# Patient Record
Sex: Female | Born: 1975 | Race: White | Hispanic: Yes | Marital: Single | Smoking: Never smoker
Health system: Southern US, Community
[De-identification: ages and names within clinical notes are randomized; demographics above are authoritative.]

## PROBLEM LIST (undated history)

## (undated) HISTORY — PX: BREAST BIOPSY: SHX20

## (undated) HISTORY — PX: APPENDECTOMY: SHX54

---

## 2005-01-19 ENCOUNTER — Emergency Department (HOSPITAL_COMMUNITY): Admission: EM | Admit: 2005-01-19 | Discharge: 2005-01-19 | Payer: Self-pay | Admitting: *Deleted

## 2007-03-31 ENCOUNTER — Inpatient Hospital Stay (HOSPITAL_COMMUNITY): Admission: AD | Admit: 2007-03-31 | Discharge: 2007-03-31 | Payer: Self-pay | Admitting: Obstetrics & Gynecology

## 2007-09-05 ENCOUNTER — Emergency Department (HOSPITAL_COMMUNITY): Admission: EM | Admit: 2007-09-05 | Discharge: 2007-09-06 | Payer: Self-pay | Admitting: Emergency Medicine

## 2007-12-10 ENCOUNTER — Emergency Department (HOSPITAL_COMMUNITY): Admission: EM | Admit: 2007-12-10 | Discharge: 2007-12-10 | Payer: Self-pay | Admitting: Emergency Medicine

## 2008-08-26 ENCOUNTER — Emergency Department (HOSPITAL_COMMUNITY): Admission: EM | Admit: 2008-08-26 | Discharge: 2008-08-26 | Payer: Self-pay | Admitting: Emergency Medicine

## 2010-05-31 ENCOUNTER — Encounter: Payer: Self-pay | Admitting: Internal Medicine

## 2010-09-22 NOTE — Assessment & Plan Note (Signed)
[facility]                        [place] OFFICE NOTE   NAME:MILESJodene, Polyak                           MRN:          [id number]  DATE:03/31/2007                            DOB:          1976-04-12    [name] is a 35 year old female who is not a patient at [employer] [facility] who presented in the waiting room with significant vaginal  bleeding and weakness.  Given her state she was brought back to make  sure she was stable and EMS was called immediately.  I went into the  room to make sure that patient was stable.  She reported that she was in  her normal state of affairs until this morning when she started noting  vaginal bleeding/spotting.  After a few minutes she passed significant  amount of large blood clots.  She continued to bleed heavily and became  very scared.  She became lightheaded but did not know if that was due to  her anxiety of the situation or the bleeding.  She reports that her last  menstrual period was on October 2 and she has not had her menstrual  period for November.  She does admit that her cycles are not regular.   As I waited for EMS, and since patient was still having obvious  bleeding, I examined her.  Marcelino Duster, my nurse, was present.   VITAL SIGNS:  Temperature 98.0, blood pressure lying down 98/58, pulse  68, respiratory rate of 18.  We had a female who appeared very anxious  and nervous but in no acute respiratory distress.  ABDOMINAL:  Significant for no significant tenderness, no palpable  masses.  PELVIC:  Significant for blood coming from the vaginal vault.  Partial  insertion of the speculum was significant for bleeding coming from the  cervical os.   EMS had arrived and I have advised EMS of the situation.  Patient was to  be transferred to [facility] per EMS personnel.   The patient was given emotional support by her husband and my nurse  Marcelino Duster given her fear of a possible  miscarriage.     Leanne Chang, M.D.  Electronically Signed    LA/MedQ  DD: 03/31/2007  DT: 04/01/2007  Job #: [id number]

## 2011-02-16 LAB — DIFFERENTIAL
Basophils Relative: 0
Monocytes Absolute: 0.5
Monocytes Relative: 6
Neutro Abs: 5.7
Neutrophils Relative %: 70

## 2011-02-16 LAB — URINALYSIS, ROUTINE W REFLEX MICROSCOPIC
Bilirubin Urine: NEGATIVE
Glucose, UA: NEGATIVE
Nitrite: NEGATIVE
pH: 6.5

## 2011-02-16 LAB — CBC
MCV: 91.5
RDW: 13.7
WBC: 8.1

## 2011-02-16 LAB — URINE MICROSCOPIC-ADD ON

## 2011-02-16 LAB — WET PREP, GENITAL: Trich, Wet Prep: NONE SEEN

## 2011-02-16 LAB — POCT PREGNANCY, URINE
Operator id: 113551
Preg Test, Ur: NEGATIVE

## 2012-12-11 ENCOUNTER — Other Ambulatory Visit: Payer: Self-pay | Admitting: Infectious Diseases

## 2012-12-11 ENCOUNTER — Ambulatory Visit
Admission: RE | Admit: 2012-12-11 | Discharge: 2012-12-11 | Disposition: A | Discharge status: Home / Self Care | Payer: No Typology Code available for payment source | Source: Ambulatory Visit | Attending: Infectious Diseases | Admitting: Infectious Diseases

## 2012-12-11 DIAGNOSIS — R7611 Nonspecific reaction to tuberculin skin test without active tuberculosis: Secondary | ICD-10-CM

## 2016-02-18 ENCOUNTER — Other Ambulatory Visit: Payer: Self-pay | Admitting: Infectious Disease

## 2016-02-18 ENCOUNTER — Ambulatory Visit
Admission: RE | Admit: 2016-02-18 | Discharge: 2016-02-18 | Disposition: A | Discharge status: Home / Self Care | Payer: No Typology Code available for payment source | Source: Ambulatory Visit | Attending: Infectious Disease | Admitting: Infectious Disease

## 2016-02-18 DIAGNOSIS — R7611 Nonspecific reaction to tuberculin skin test without active tuberculosis: Secondary | ICD-10-CM

## 2016-09-07 ENCOUNTER — Encounter (HOSPITAL_COMMUNITY): Payer: Self-pay | Admitting: Family Medicine

## 2016-09-07 ENCOUNTER — Ambulatory Visit (HOSPITAL_COMMUNITY)
Admission: EM | Admit: 2016-09-07 | Discharge: 2016-09-07 | Disposition: A | Discharge status: Home / Self Care | Payer: No Typology Code available for payment source | Attending: Family Medicine | Admitting: Family Medicine

## 2016-09-07 DIAGNOSIS — J209 Acute bronchitis, unspecified: Secondary | ICD-10-CM

## 2016-09-07 DIAGNOSIS — J309 Allergic rhinitis, unspecified: Secondary | ICD-10-CM

## 2016-09-07 MED ORDER — CETIRIZINE HCL 10 MG PO TABS: 10.0000 mg | ORAL_TABLET | Freq: Every day | ORAL | 0 refills | Status: DC

## 2016-09-07 MED ORDER — IPRATROPIUM BROMIDE 0.03 % NA SOLN: 2.0000 | Freq: Two times a day (BID) | NASAL | 0 refills | Status: DC

## 2016-09-07 MED ORDER — AZITHROMYCIN 250 MG PO TABS: ORAL_TABLET | ORAL | 0 refills | Status: DC

## 2016-09-07 NOTE — ED Triage Notes (Signed)
Pt here for 3 weeks of nasal congestion, coughing and watery eyes but worsening over the past week, sts she has been feverish and weak and SOB at times.

## 2016-09-07 NOTE — Discharge Instructions (Signed)
Use zyrtec and atrovent nasal spray daily to help with allergies. Take azithromycin for the next 5 days to help with bronchitis/cough. You should return if your symptoms get worse. It is recommended that you establish care with a PCP for ongoing care.

## 2016-09-07 NOTE — ED Provider Notes (Signed)
[facility]    CSN: [id number] Arrival date & time: 09/07/16  1329     History   Chief Complaint Chief Complaint  Patient presents with  . Cough  . Nasal Congestion    HPI Alexandra Boyd is a 41 y.o. female with a history of allergies presenting for 3 weeks of cough and nasal congestion.   She has had constant, worsening nasal congestion and clear rhinorrhea for about 1 months and 3 weeks of cough that has worsened with sputum over the past week. She's had subjective fevers and occasional dyspnea though currently denies dyspnea, wheezing, chest pain, PND, orthopnea, leg swelling. No medications tried. Non smoker, but had chemical irritation pneumonitis in the past (2010).   HPI  History reviewed. No pertinent past medical history.  There are no active problems to display for this patient.   History reviewed. No pertinent surgical history.  OB History    No data available       Home Medications    Prior to Admission medications   Medication Sig Start Date End Date Taking? Authorizing Provider  azithromycin (ZITHROMAX) 250 MG tablet Take 2 tabs once than 1 tab daily for 4 days 09/07/16   Tyrone Nine, MD  cetirizine (ZYRTEC) 10 MG tablet Take 1 tablet (10 mg total) by mouth daily. 09/07/16   Tyrone Nine, MD  ipratropium (ATROVENT) 0.03 % nasal spray Place 2 sprays into both nostrils every 12 (twelve) hours. 09/07/16   Tyrone Nine, MD    Family History History reviewed. No pertinent family history.  Social History Social History  Substance Use Topics  . Smoking status: Never Smoker  . Smokeless tobacco: Never Used  . Alcohol use Not on file     Allergies   Patient has no known allergies.   Review of Systems Review of Systems Per HPI  Physical Exam Triage Vital Signs ED Triage Vitals [09/07/16 1354]  Enc Vitals Group     BP 102/71     Pulse Rate 82     Resp 18     Temp 98.6 F (37 C)     Temp src      SpO2 96 %     Weight      Height    Head Circumference      Peak Flow      Pain Score      Pain Loc      Pain Edu?      Excl. in GC?    No data found.   Updated Vital Signs BP 102/71   Pulse 82   Temp 98.6 F (37 C)   Resp 18   LMP 09/01/2016   SpO2 96%   Physical Exam  Constitutional: She appears well-developed and well-nourished. No distress.  HENT:  Head: Normocephalic and atraumatic.  Right Ear: External ear normal.  Left Ear: External ear normal.  Mouth/Throat: Oropharynx is clear and moist.  boggy turbinates, no exudates  Eyes: Conjunctivae are normal. Pupils are equal, round, and reactive to light.  Neck: Neck supple. No JVD present.  Cardiovascular: Normal rate and regular rhythm.  Exam reveals no friction rub.   No murmur heard. Pulmonary/Chest: Effort normal and breath sounds normal. No respiratory distress. She has no wheezes.  Abdominal: Soft. There is no tenderness.  Musculoskeletal: She exhibits no edema.  Lymphadenopathy:    She has no cervical adenopathy.  Neurological: She is alert.  Skin: Skin is warm and dry. Capillary refill takes less  than 2 seconds.  Psychiatric: She has a normal mood and affect.  Nursing note and vitals reviewed.    UC Treatments / Results  Labs (all labs ordered are listed, but only abnormal results are displayed) Labs Reviewed - No data to display  EKG  EKG Interpretation None       Radiology No results found.  Procedures Procedures (including critical care time)  Medications Ordered in UC Medications - No data to display   Initial Impression / Assessment and Plan / UC Course  I have reviewed the triage vital signs and the nursing notes.  Pertinent labs & imaging results that were available during my care of the patient were reviewed by me and considered in my medical decision making (see chart for details).  Final Clinical Impressions(s) / UC Diagnoses   Final diagnoses:  Acute bronchitis, unspecified organism  Allergic rhinitis,  unspecified seasonality, unspecified trigger   41 y.o. female with allergies.  - Atrovent nasal spray, zyrtec - Will also cover for atypical pathogens with azithromycin - Refer to PCP/allergist for ongoing work up  [employer]  Prescriptions New Prescriptions   AZITHROMYCIN (ZITHROMAX) 250 MG TABLET    Take 2 tabs once than 1 tab daily for 4 days   CETIRIZINE (ZYRTEC) 10 MG TABLET    Take 1 tablet (10 mg total) by mouth daily.   IPRATROPIUM (ATROVENT) 0.03 % NASAL SPRAY    Place 2 sprays into both nostrils every 12 (twelve) hours.     Tyrone Nine, MD 09/07/16 [phone]

## 2016-12-14 ENCOUNTER — Telehealth (HOSPITAL_COMMUNITY): Payer: Self-pay | Admitting: *Deleted

## 2016-12-14 ENCOUNTER — Other Ambulatory Visit: Payer: Self-pay | Admitting: Obstetrics and Gynecology

## 2016-12-14 DIAGNOSIS — N63 Unspecified lump in unspecified breast: Secondary | ICD-10-CM

## 2016-12-14 DIAGNOSIS — N644 Mastodynia: Secondary | ICD-10-CM

## 2016-12-14 NOTE — Telephone Encounter (Signed)
Called patient with Spanish interpreter Nira ConnJulia Sowell to schedule appointment with [facility]. Appointment scheduled for Tuesday, December 28, 2016 at 0845. Gave patient appointment and details. Patient verbalized understanding.

## 2016-12-28 ENCOUNTER — Encounter (HOSPITAL_COMMUNITY): Payer: Self-pay

## 2016-12-28 ENCOUNTER — Other Ambulatory Visit: Payer: Self-pay | Admitting: Obstetrics and Gynecology

## 2016-12-28 ENCOUNTER — Ambulatory Visit
Admission: RE | Admit: 2016-12-28 | Discharge: 2016-12-28 | Disposition: A | Discharge status: Home / Self Care | Payer: Self-pay | Source: Ambulatory Visit | Attending: Obstetrics and Gynecology | Admitting: Obstetrics and Gynecology

## 2016-12-28 ENCOUNTER — Ambulatory Visit
Admission: RE | Admit: 2016-12-28 | Discharge: 2016-12-28 | Disposition: A | Discharge status: Home / Self Care | Payer: No Typology Code available for payment source | Source: Ambulatory Visit | Attending: Obstetrics and Gynecology | Admitting: Obstetrics and Gynecology

## 2016-12-28 ENCOUNTER — Ambulatory Visit (HOSPITAL_COMMUNITY)
Admission: RE | Admit: 2016-12-28 | Discharge: 2016-12-28 | Disposition: A | Discharge status: Home / Self Care | Payer: Self-pay | Source: Ambulatory Visit | Attending: Obstetrics and Gynecology | Admitting: Obstetrics and Gynecology

## 2016-12-28 VITALS — Temp 97.9°F | Ht 65.0 in | Wt 132.5 lb

## 2016-12-28 DIAGNOSIS — N63 Unspecified lump in unspecified breast: Secondary | ICD-10-CM

## 2016-12-28 DIAGNOSIS — N644 Mastodynia: Secondary | ICD-10-CM

## 2016-12-28 DIAGNOSIS — Z1239 Encounter for other screening for malignant neoplasm of breast: Secondary | ICD-10-CM

## 2016-12-28 DIAGNOSIS — N6310 Unspecified lump in the right breast, unspecified quadrant: Secondary | ICD-10-CM

## 2016-12-28 DIAGNOSIS — N6315 Unspecified lump in the right breast, overlapping quadrants: Secondary | ICD-10-CM

## 2016-12-28 NOTE — Patient Instructions (Signed)
Explained breast self awareness with Tillie Fantasia. Patient did not need a Pap smear today due to last Pap smear was 11/07/2016 per patient. Let her know [facility] will cover Pap smears every 3 years unless has a history of abnormal Pap smears. Referred patient to the [facility] for diagnostic mammogram and right breast ultrasound. Appointment scheduled for Tuesday, December 28, 2016 at 0920. Ziasia Vongphachanh verbalized understanding.  Eliot Popper, Kathaleen Maser, RN 9:19 AM

## 2016-12-28 NOTE — Progress Notes (Signed)
Complaints of right breast lump since 2002 that a biopsy was completed in [place] in 2004. Patient is unsure of results and recommended follow up. Patient stated she had [facility]. Patient stated the pain has increased within the right breast. Patient states the pain comes and goes. Patient rates the pain at a 10 out of 10.  Pap Smear: Pap smear not completed today. Last Pap smear was 11/07/2016 at the [facility] and normal per patient. Per patient has no history of an abnormal Pap smear. No Pap smear results are in EPIC.  Physical exam: Breasts Breasts symmetrical. No skin abnormalities bilateral breasts. No nipple retraction bilateral breasts. No nipple discharge bilateral breasts. No lymphadenopathy. No lumps palpated left breast. Palpated a lump within the right breast at 9 o'clock 3 cm from the nipple. Complaints of bilateral outer and right nipple pain on exam. Referred patient to the [facility] for diagnostic mammogram and right breast ultrasound. Appointment scheduled for Tuesday, December 28, 2016 at 0920.        Pelvic/Bimanual No Pap smear completed today since last Pap smear was 11/07/2016 per patient. Pap smear not indicated per [facility] guidelines.   Smoking History: Patient has never smoked.  Patient Navigation: Patient education provided. Access to services provided for patient through [facility] program.

## 2017-06-30 ENCOUNTER — Other Ambulatory Visit: Payer: No Typology Code available for payment source

## 2017-10-31 ENCOUNTER — Encounter (HOSPITAL_COMMUNITY): Payer: Self-pay | Admitting: Emergency Medicine

## 2017-10-31 ENCOUNTER — Ambulatory Visit (HOSPITAL_COMMUNITY)
Admission: EM | Admit: 2017-10-31 | Discharge: 2017-10-31 | Disposition: A | Discharge status: Home / Self Care | Payer: No Typology Code available for payment source | Attending: Family Medicine | Admitting: Family Medicine

## 2017-10-31 DIAGNOSIS — H209 Unspecified iridocyclitis: Secondary | ICD-10-CM

## 2017-10-31 DIAGNOSIS — H5712 Ocular pain, left eye: Secondary | ICD-10-CM

## 2017-10-31 MED ORDER — PREDNISOLONE ACETATE 1 % OP SUSP: 1.0000 [drp] | Freq: Three times a day (TID) | OPHTHALMIC | 0 refills | Status: DC | PRN

## 2017-10-31 NOTE — ED Provider Notes (Signed)
[facility]   [id number] 10/31/17 Arrival Time: 1944   SUBJECTIVE:  Alexandra FantasiaOlga Boyd is a 42 y.o. female who presents to the urgent care with complaint of sharp pain in left eye. Affected eye is red, denies itchiness. Blurry vision.  H/O arthritis when aged 42.  No rash, mouth sores or significant joint pain now  No eye discharge.  Some headache on left side.   History reviewed. No pertinent past medical history. No family history on file. Social History   Socioeconomic History  . Marital status: Single    Spouse name: Not on file  . Number of children: Not on file  . Years of education: Not on file  . Highest education level: Not on file  Occupational History  . Not on file  Social Needs  . Financial resource strain: Not on file  . Food insecurity:    Worry: Not on file    Inability: Not on file  . Transportation needs:    Medical: Not on file    Non-medical: Not on file  Tobacco Use  . Smoking status: Never Smoker  . Smokeless tobacco: Never Used  Substance and Sexual Activity  . Alcohol use: No  . Drug use: No  . Sexual activity: Yes    Partners: Male    Birth control/protection: None  Lifestyle  . Physical activity:    Days per week: Not on file    Minutes per session: Not on file  . Stress: Not on file  Relationships  . Social connections:    Talks on phone: Not on file    Gets together: Not on file    Attends religious service: Not on file    Active member of club or organization: Not on file    Attends meetings of clubs or organizations: Not on file    Relationship status: Not on file  . Intimate partner violence:    Fear of current or ex partner: Not on file    Emotionally abused: Not on file    Physically abused: Not on file    Forced sexual activity: Not on file  Other Topics Concern  . Not on file  Social History Narrative  . Not on file   No outpatient medications have been marked as taking for the 10/31/17 encounter [facility](Hospital  Encounter).   No Known Allergies    ROS: As per HPI, remainder of ROS negative.   OBJECTIVE:   Vitals:   10/31/17 2007  BP: 104/70  Pulse: 71  Resp: 16  Temp: 98.9 F (37.2 C)  TempSrc: Oral  SpO2: 100%  Weight: 130 lb (59 kg)     General appearance: alert; no distress Eyes: PERRL; EOMI; conjunctiva injected medially HENT: normocephalic; atraumatic; TMs normal, canal normal, external ears normal without trauma; nasal mucosa normal; oral mucosa normal Neck: supple Lungs: clear to auscultation bilaterally Heart: regular rate and rhythm Back: no CVA tenderness Extremities: no cyanosis or edema; symmetrical with no gross deformities Skin: warm and dry Neurologic: normal gait; grossly normal Psychological: alert and cooperative; normal mood and affect      Labs:  Results for orders placed or performed during the hospital encounter of 03/31/07  Wet prep, genital  Result Value Ref Range   Yeast Wet Prep HPF POC NONE SEEN    Trich, Wet Prep NONE SEEN    Clue Cells Wet Prep HPF POC NONE SEEN    WBC, Wet Prep HPF POC FEW MANY BACTERIA SEEN (A)   Urinalysis, Routine  w reflex microscopic  Result Value Ref Range   Color, Urine YELLOW    APPearance CLOUDY (A)    Specific Gravity, Urine 1.020    pH 6.5    Glucose, UA NEGATIVE    Hgb urine dipstick LARGE (A)    Bilirubin Urine NEGATIVE    Ketones, ur NEGATIVE    Protein, ur 100 (A)    Urobilinogen, UA 0.2    Nitrite NEGATIVE    Leukocytes, UA NEGATIVE   Urine microscopic-add on  Result Value Ref Range   Squamous Epithelial / LPF FEW (A)    WBC, UA 0-2    RBC / HPF TOO NUMEROUS TO COUNT    Bacteria, UA FEW (A)   CBC  Result Value Ref Range   WBC 8.1    RBC 3.57 (L)    Hemoglobin 11.3 (L)    HCT 32.7 (L)    MCV 91.5    MCHC 34.6    RDW 13.7    Platelets 183   Differential  Result Value Ref Range   Neutrophils Relative % 70    Neutro Abs 5.7    Lymphocytes Relative 22    Lymphs Abs 1.8    Monocytes  Relative 6    Monocytes Absolute 0.5    Eosinophils Relative 1    Eosinophils Absolute 0.1 (L)    Basophils Relative 0    Basophils Absolute 0.0   GC/chlamydia probe amp, genital  Result Value Ref Range   GC Probe Amp, Genital      NEGATIVE (NOTE)  Testing performed using the BD Probetec ET Chlamydia trachomatis and Neisseria gonorrhea amplified DNA assay.   Chlamydia, DNA Probe      NEGATIVE (NOTE)  Testing performed using the BD Probetec ET Chlamydia trachomatis and Neisseria gonorrhea amplified DNA assay.  Pregnancy, urine POC  Result Value Ref Range   Operator id [id number]    Preg Test, Ur      NEGATIVE        THE SENSITIVITY OF THIS METHODOLOGY IS >24 mIU/mL    Labs Reviewed - No data to display  No results found.     ASSESSMENT & PLAN:  1. Iritis     Meds ordered this encounter  Medications  . prednisoLONE acetate (PRED FORTE) 1 % ophthalmic suspension    Sig: Place 1 drop into the left eye 3 (three) times daily as needed.    Dispense:  5 mL    Refill:  0    Reviewed expectations re: course of current medical issues. Questions answered. Outlined signs and symptoms indicating need for more acute intervention. Patient verbalized understanding. After Visit Summary given.       Elvina Sidle, MD 10/31/17 2049

## 2017-10-31 NOTE — Discharge Instructions (Signed)
If the eye redness and discomfort persists by Wednesday, call the eye doctor for an appointment.

## 2017-10-31 NOTE — ED Triage Notes (Signed)
PT reports sharp pain in left eye. Affected eye is red, denies itchiness. Blurry vision.

## 2018-05-09 ENCOUNTER — Ambulatory Visit (HOSPITAL_COMMUNITY)
Admission: EM | Admit: 2018-05-09 | Discharge: 2018-05-09 | Disposition: A | Discharge status: Home / Self Care | Payer: Self-pay | Attending: Family Medicine | Admitting: Family Medicine

## 2018-05-09 ENCOUNTER — Encounter (HOSPITAL_COMMUNITY): Payer: Self-pay | Admitting: Emergency Medicine

## 2018-05-09 DIAGNOSIS — K625 Hemorrhage of anus and rectum: Secondary | ICD-10-CM | POA: Insufficient documentation

## 2018-05-09 DIAGNOSIS — K5909 Other constipation: Secondary | ICD-10-CM | POA: Insufficient documentation

## 2018-05-09 MED ORDER — POLYETHYLENE GLYCOL 3350 17 G PO PACK: 17.0000 g | PACK | Freq: Every day | ORAL | 0 refills | Status: DC

## 2018-05-09 NOTE — ED Provider Notes (Signed)
[facility]    CSN: [id number] Arrival date & time: 05/09/18  1128     History   Chief Complaint Chief Complaint  Patient presents with  . Rectal Bleeding    HPI Alexandra Boyd is a 42 y.o. female who presents to the UC for rectal bleeding that started 3 days ago. Patient describes the bleeding as bright red. Patient reports constipation. Last BM this morning and was hard. Patient reports occasional cramping in left lower abdomen. Patient concerned because her brother and sister had colonoscopy and had polyps.     HPI  History reviewed. No pertinent past medical history.  There are no active problems to display for this patient.   Past Surgical History:  Procedure Laterality Date  . BREAST BIOPSY Right     OB History    Gravida  2   Para  1   Term      Preterm      AB  1   Living  1     SAB  1   TAB      Ectopic      Multiple      Live Births  1            Home Medications    Prior to Admission medications   Medication Sig Start Date End Date Taking? Authorizing Provider  polyethylene glycol (MIRALAX) packet Take 17 g by mouth daily. 05/09/18   Janne NapoleonNeese, Smokey Melott M, NP    Family History History reviewed. No pertinent family history.  Social History Social History   Tobacco Use  . Smoking status: Never Smoker  . Smokeless tobacco: Never Used  Substance Use Topics  . Alcohol use: No  . Drug use: No     Allergies   Patient has no known allergies.   Review of Systems Review of Systems  Gastrointestinal: Positive for blood in stool and constipation. Abdominal pain: cramping.  All other systems reviewed and are negative.    Physical Exam Triage Vital Signs ED Triage Vitals  Enc Vitals Group     BP 05/09/18 1308 (!) 101/58     Pulse Rate 05/09/18 1308 72     Resp 05/09/18 1308 18     Temp 05/09/18 1308 98 F (36.7 C)     Temp Source 05/09/18 1308 Oral     SpO2 05/09/18 1308 100 %     Weight --      Height --    Head Circumference --      Peak Flow --      Pain Score 05/09/18 1309 0     Pain Loc --      Pain Edu? --      Excl. in GC? --    No data found.  Updated Vital Signs BP (!) 101/58 (BP Location: Left Arm)   Pulse 72   Temp 98 F (36.7 C) (Oral)   Resp 18   SpO2 100%   Visual Acuity   Physical Exam Vitals signs and nursing note reviewed.  Constitutional:      Appearance: She is well-developed.  HENT:     Head: Normocephalic and atraumatic.     Nose: Nose normal.  Eyes:     Conjunctiva/sclera: Conjunctivae normal.  Neck:     Musculoskeletal: Neck supple.  Cardiovascular:     Rate and Rhythm: Normal rate.  Pulmonary:     Effort: Pulmonary effort is normal.  Abdominal:     Palpations: Abdomen is soft.  Comments: Mildly tender with deep palpation LLQ. No rebound or guarding.   Genitourinary:    Rectum: No tenderness or external hemorrhoid.     Comments: Hard stool palpated on rectal exam. Musculoskeletal: Normal range of motion.  Skin:    General: Skin is warm and dry.  Neurological:     Mental Status: She is alert and oriented to person, place, and time.  Psychiatric:        Mood and Affect: Mood normal.      UC Treatments / Results  Labs (all labs ordered are listed, but only abnormal results are displayed) Labs Reviewed - No data to display  Radiology No results found.  Procedures Procedures (including critical care time)  Medications Ordered in UC Medications - No data to display  Initial Impression / Assessment and Plan / UC Course  I have reviewed the triage vital signs and the nursing notes. 42 y.o. female here with constipation and bright red blood with straining with stool stable for d/c with normal VS and no bleeding noted at this time. Will treat for constipation with Miralax and patient will f/u with GI if symptoms persist.   Final Clinical Impressions(s) / UC Diagnoses   Final diagnoses:  Other constipation  Rectal bleeding    Discharge Instructions   None    ED Prescriptions    Medication Sig Dispense Auth. Provider   polyethylene glycol (MIRALAX) packet Take 17 g by mouth daily. 14 each Janne NapoleonNeese, Aayla Marrocco M, NP     Controlled Substance Prescriptions [place] Controlled Substance Registry consulted? Not Applicable   Janne Napoleoneese, Alishia Lebo M, [place] 05/09/18 [phone]

## 2018-05-09 NOTE — ED Triage Notes (Signed)
Pt sts rectal bleeding with red blood x 3 days

## 2018-05-09 NOTE — ED Notes (Signed)
At bedside for provider rectal exam

## 2018-06-02 ENCOUNTER — Ambulatory Visit: Payer: Self-pay | Attending: Family Medicine | Admitting: Family Medicine

## 2018-06-02 ENCOUNTER — Encounter: Payer: Self-pay | Admitting: Family Medicine

## 2018-06-02 VITALS — BP 101/68 | HR 98 | Temp 98.4°F | Resp 18 | Ht 67.0 in | Wt 137.0 lb

## 2018-06-02 DIAGNOSIS — R195 Other fecal abnormalities: Secondary | ICD-10-CM | POA: Insufficient documentation

## 2018-06-02 DIAGNOSIS — K5900 Constipation, unspecified: Secondary | ICD-10-CM

## 2018-06-02 DIAGNOSIS — Z23 Encounter for immunization: Secondary | ICD-10-CM

## 2018-06-02 DIAGNOSIS — Z800 Family history of malignant neoplasm of digestive organs: Secondary | ICD-10-CM

## 2018-06-02 DIAGNOSIS — Z91018 Allergy to other foods: Secondary | ICD-10-CM

## 2018-06-02 MED ORDER — EPINEPHRINE 0.3 MG/0.3ML IJ SOAJ: 0.3000 mg | INTRAMUSCULAR | 11 refills | Status: AC | PRN

## 2018-06-02 MED ORDER — POLYETHYLENE GLYCOL 3350 17 G PO PACK: 17.0000 g | PACK | Freq: Every day | ORAL | 3 refills | Status: AC

## 2018-06-02 NOTE — Progress Notes (Signed)
Subjective:    Patient ID: Alexandra Boyd, female    DOB: 15-Oct-1975, 43 y.o.   MRN: [id number]  HPI        43 yo female new to the practice who is s/p [facility] urgent care visit for rectal bleeding and constipation on 05/09/2018.  At the time of her urgent care visit, patient was having constipation and noticed some bright red blood with passage of a hard bowel movement.  Patient was also having some crampy left lower abdominal discomfort.  Patient states that since that time she has noticed no further bleeding.  Patient has increased her intake of water and fiber.  Patient states that she was prescribed MiraLAX and only received about 14 packets which she has now completed.  Patient would like a new prescription for MiraLAX packets.      Patient states that her brother and sister have both previously had colonoscopies and had polyps which were removed.  Patient states that her brother was recently told that 1 of his polyps was abnormal, cancerous but has not yet seen a specialist to set up a treatment plan.  Patient states that she is very concerned about colon cancer and would like to be referred to have a colonoscopy.  Patient denies any abnormal weight loss.  Patient states that she has actually been following a diet plan which has led to weight loss ([employer]).  Patient has had no night sweats, no fever or chills.  Patient denies any increase in fatigue.  Patient has had no further rectal bleeding, no further change in bowel habits-bowel movements are now regular, no black stools or dark-colored stools.      Patient reports that she does not drink or smoke.  Patient is currently in a committed relationship.  Patient has family history of father with stomach cancer in addition to sister with colon polyps and brother with recent diagnosis of colon cancer.  Patient has had no significant past ongoing medical problems.  Patient reports surgical history of appendectomy and benign biopsy of the right  breast.  Patient reports that she has been referred to have her mammogram.  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  Patient states that when she eats apples she feels as if her throat is swelling/closing and she has difficulty talking.  Patient states that she tends to carry Benadryl with her as this will help if she accidentally eats apples.  Patient has never required emergency department evaluation due to her apple allergy.   Review of Systems  Constitutional: Negative for chills, fatigue and fever.  HENT: Negative for congestion, sore throat and trouble swallowing.   Respiratory: Negative for cough and shortness of breath.   Cardiovascular: Negative for chest pain, palpitations and leg swelling.  Gastrointestinal: Negative for abdominal distention, abdominal pain, anal bleeding, blood in stool, constipation, diarrhea, nausea, rectal pain and vomiting.  Endocrine: Negative for polydipsia, polyphagia and polyuria.  Genitourinary: Negative for dysuria, flank pain and frequency.  Musculoskeletal: Negative for back pain and gait problem.  Allergic/Immunologic: Positive for food allergies. Negative for environmental allergies and immunocompromised state.  Neurological: Negative for dizziness and headaches.  Hematological: Negative for adenopathy. Does not bruise/bleed easily.       Objective:   Physical Exam BP 101/68 (BP Location: Left Arm, Patient Position: Sitting, Cuff Size: Normal)   Pulse 98   Temp 98.4 F (36.9 C) (Oral)   Resp 18   Ht 5\' 7"  (1.702  m)   Wt 137 lb (62.1 kg)   LMP 06/02/2018   SpO2 100%   BMI 21.46 kg/m Vital signs reviewed, no nursing note available General-well-nourished, well-developed female in no acute distress EENT-conjunctiva normal, no jaundice, extraocular movements intact.  TMs gray bilaterally, nares within normal, patient with mild posterior pharynx erythema.  Neck-supple, no lymphadenopathy, no thyromegaly,  no carotid bruit Lungs-clear to auscultation bilaterally Cardiovascular-regular rate and regular rhythm Abdomen-normal bowel sounds, soft and nontender Back-no CVA tenderness Extremities-no edema      Assessment & Plan:  1. Constipation, unspecified constipation type Patient with ED visit for constipation which patient feels has now resolved since she has increased water intake as well as increase the use of fiber.  Patient would like to have a new prescription for MiraLAX packets therefore this was sent into the pharmacy.  Patient has been asked to return for future labs including TSH, BMP and CBC as she wishes to wait until she has had a chance to apply for financial assistance program through this office.  Patient will also be referred to GI referral will be delayed for approximately 5 to 6 weeks in order to give patient time to apply for financial assistance program through this office.  Patient is also to do home fecal occult blood test to see if she may have some microscopic blood in the stool. - Fecal occult blood, imunochemical([facility]) - Basic Metabolic Panel; Future - TSH; Future - CBC with Differential; Future - Ambulatory referral to Gastroenterology - polyethylene glycol (MIRALAX) packet; Take 17 g by mouth daily. Mixed with at least 8 ounces of water as needed for constipation  Dispense: 30 each; Refill: 3  2. Family history of colon cancer Patient reports family history of her brother recently been diagnosed with colon cancer.  Patient is to do home Hemoccult test and patient additionally will be referred to gastroenterology for further evaluation and treatment. - Fecal occult blood, imunochemical([facility]) - Ambulatory referral to Gastroenterology  3. 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  Prescription will be sent to this pharmacy for an EpiPen to see if this may be available to  the patient through a patient assistance program with the manufacture of this product. - EPINEPHrine (EPIPEN 2-PAK) 0.3 mg/0.3 mL IJ SOAJ injection; Inject 0.3 mLs (0.3 mg total) into the muscle as needed for anaphylaxis. /severe allergic reaction  Dispense: 1 Device; Refill: 11  4. Need for immunization against influenza Patient was offered and agreed to have influenza immunization at today's visit.  Patient was also provided with an educational handout regarding this immunization - Flu Vaccine QUAD 36+ mos IM  An After Visit Summary was printed and given to the patient.  Return in about 3 months (around 09/01/2018) for constipation; labs in 6 weeks.

## 2018-06-03 ENCOUNTER — Encounter: Payer: Self-pay | Admitting: Family Medicine

## 2018-06-12 ENCOUNTER — Ambulatory Visit: Payer: Self-pay | Attending: Family Medicine

## 2018-07-11 ENCOUNTER — Ambulatory Visit: Payer: Self-pay | Attending: Family Medicine

## 2018-07-11 DIAGNOSIS — K5900 Constipation, unspecified: Secondary | ICD-10-CM

## 2018-07-12 LAB — BASIC METABOLIC PANEL WITH GFR
BUN/Creatinine Ratio: 21 (ref 9–23)
BUN: 12 mg/dL (ref 6–24)
CO2: 24 mmol/L (ref 20–29)
Calcium: 8.8 mg/dL (ref 8.7–10.2)
Chloride: 100 mmol/L (ref 96–106)
Creatinine, Ser: 0.57 mg/dL (ref 0.57–1.00)
GFR calc Af Amer: 133 mL/min/1.73
GFR calc non Af Amer: 116 mL/min/1.73
Glucose: 86 mg/dL (ref 65–99)
Potassium: 4.1 mmol/L (ref 3.5–5.2)
Sodium: 138 mmol/L (ref 134–144)

## 2018-07-12 LAB — CBC WITH DIFFERENTIAL/PLATELET
Basophils Absolute: 0 x10E3/uL (ref 0.0–0.2)
Basos: 0 %
EOS (ABSOLUTE): 0.1 x10E3/uL (ref 0.0–0.4)
Eos: 1 %
Hematocrit: 38.6 % (ref 34.0–46.6)
Hemoglobin: 12.5 g/dL (ref 11.1–15.9)
Immature Grans (Abs): 0 x10E3/uL (ref 0.0–0.1)
Immature Granulocytes: 0 %
Lymphocytes Absolute: 1.7 x10E3/uL (ref 0.7–3.1)
Lymphs: 30 %
MCH: 30.7 pg (ref 26.6–33.0)
MCHC: 32.4 g/dL (ref 31.5–35.7)
MCV: 95 fL (ref 79–97)
Monocytes Absolute: 0.5 x10E3/uL (ref 0.1–0.9)
Monocytes: 8 %
Neutrophils Absolute: 3.3 x10E3/uL (ref 1.4–7.0)
Neutrophils: 61 %
Platelets: 209 x10E3/uL (ref 150–450)
RBC: 4.07 x10E6/uL (ref 3.77–5.28)
RDW: 12 % (ref 11.7–15.4)
WBC: 5.6 x10E3/uL (ref 3.4–10.8)

## 2018-07-12 LAB — TSH: TSH: 1.69 u[IU]/mL (ref 0.450–4.500)

## 2018-07-17 ENCOUNTER — Telehealth: Payer: Self-pay | Admitting: *Deleted

## 2018-07-17 NOTE — Telephone Encounter (Signed)
Medical Assistant used [employer] to contact patient.  Interpreter Name: Carlean Purl #: [id number] Please inform patient of labs being normal.

## 2018-07-17 NOTE — Telephone Encounter (Signed)
-----   Message from Cain Saupe, MD sent at 07/12/2018 11:12 AM EST ----- Please notify patient that CBC normal, TSH normal and normal BMP

## 2018-08-31 ENCOUNTER — Ambulatory Visit: Payer: Self-pay | Attending: Family Medicine | Admitting: Family Medicine

## 2018-08-31 ENCOUNTER — Other Ambulatory Visit: Payer: Self-pay

## 2020-01-07 ENCOUNTER — Other Ambulatory Visit: Payer: Self-pay

## 2020-01-07 ENCOUNTER — Telehealth: Payer: Self-pay

## 2020-01-07 ENCOUNTER — Ambulatory Visit (HOSPITAL_COMMUNITY)
Admission: EM | Admit: 2020-01-07 | Discharge: 2020-01-07 | Disposition: A | Discharge status: Home / Self Care | Payer: Self-pay | Attending: Family Medicine | Admitting: Family Medicine

## 2020-01-07 ENCOUNTER — Encounter (HOSPITAL_COMMUNITY): Payer: Self-pay

## 2020-01-07 DIAGNOSIS — N644 Mastodynia: Secondary | ICD-10-CM

## 2020-01-07 DIAGNOSIS — Z87898 Personal history of other specified conditions: Secondary | ICD-10-CM

## 2020-01-07 DIAGNOSIS — N63 Unspecified lump in unspecified breast: Secondary | ICD-10-CM

## 2020-01-07 NOTE — Telephone Encounter (Signed)
Telephoned patient at home number. Patient did not answer, mail box not set up, unable to leave a message from [facility].

## 2020-01-07 NOTE — ED Provider Notes (Signed)
[facility]    CSN: [id number] Arrival date & time: 01/07/20  1051      History   Chief Complaint Chief Complaint  Patient presents with  . Breast Pain    HPI Alexandra Boyd is a 44 y.o. female.   2 weeks of worsening pain in b/l breast, worse on the left. Also feels heat in them. States she has had this episodically in the past, has had some abnormal imaging with probably benign masses in the past. Last mammogram was in 2018 per record review, with recommendation to repeat imaging in 6 months. Not taking anything for her sxs.      History reviewed. No pertinent past medical history.  There are no problems to display for this patient.   Past Surgical History:  Procedure Laterality Date  . APPENDECTOMY    . BREAST BIOPSY Right     OB History    Gravida  2   Para  1   Term      Preterm      AB  1   Living  1     SAB  1   TAB      Ectopic      Multiple      Live Births  1            Home Medications    Prior to Admission medications   Medication Sig Start Date End Date Taking? Authorizing Provider  EPINEPHrine (EPIPEN 2-PAK) 0.3 mg/0.3 mL IJ SOAJ injection Inject 0.3 mLs (0.3 mg total) into the muscle as needed for anaphylaxis. /severe allergic reaction 06/02/18   Fulp, Cammie, MD  polyethylene glycol (MIRALAX) packet Take 17 g by mouth daily. Mixed with at least 8 ounces of water as needed for constipation 06/02/18   Cain Saupe, MD    Family History Family History  Problem Relation Age of Onset  . Stomach cancer Father   . Colon polyps Sister   . Colon cancer Brother   . Hypertension Mother     Social History Social History   Tobacco Use  . Smoking status: Never Smoker  . Smokeless tobacco: Never Used  Substance Use Topics  . Alcohol use: Yes    Comment: OCC  . Drug use: No     Allergies   Fruit & vegetable daily [nutritional supplements]   Review of Systems Review of Systems   Physical Exam Triage  Vital Signs ED Triage Vitals  Enc Vitals Group     BP 01/07/20 1326 103/63     Pulse Rate 01/07/20 1326 71     Resp 01/07/20 1326 17     Temp 01/07/20 1326 98.2 F (36.8 C)     Temp Source 01/07/20 1326 Oral     SpO2 01/07/20 1326 92 %     Weight --      Height --      Head Circumference --      Peak Flow --      Pain Score 01/07/20 1322 8     Pain Loc --      Pain Edu? --      Excl. in GC? --    No data found.  Updated Vital Signs BP 103/63 (BP Location: Right Arm)   Pulse 71   Temp 98.2 F (36.8 C) (Oral)   Resp 17   LMP 12/31/2019   SpO2 92%   Visual Acuity Right Eye Distance:   Left Eye Distance:   Bilateral Distance:  Right Eye Near:   Left Eye Near:    Bilateral Near:     Physical Exam Vitals and nursing note reviewed. Exam conducted with a chaperone present.  Constitutional:      Appearance: Normal appearance. She is not ill-appearing.  HENT:     Head: Atraumatic.  Eyes:     Extraocular Movements: Extraocular movements intact.     Conjunctiva/sclera: Conjunctivae normal.  Cardiovascular:     Rate and Rhythm: Normal rate and regular rhythm.     Heart sounds: Normal heart sounds.  Pulmonary:     Effort: Pulmonary effort is normal.     Breath sounds: Normal breath sounds.  Chest:     Breasts:        Right: No nipple discharge or skin change.        Left: No nipple discharge or skin change.       Comments: No obvious asymmetry in breasts. No erythema Musculoskeletal:        General: Normal range of motion.     Cervical back: Normal range of motion and neck supple.  Lymphadenopathy:     Upper Body:     Right upper body: No axillary adenopathy.     Left upper body: No axillary adenopathy.  Skin:    General: Skin is warm and dry.  Neurological:     Mental Status: She is alert and oriented to person, place, and time.  Psychiatric:        Mood and Affect: Mood normal.        Thought Content: Thought content normal.        Judgment: Judgment  normal.      UC Treatments / Results  Labs (all labs ordered are listed, but only abnormal results are displayed) Labs Reviewed - No data to display  EKG   Radiology No results found.  Procedures Procedures (including critical care time)  Medications Ordered in UC Medications - No data to display  Initial Impression / Assessment and Plan / UC Course  I have reviewed the triage vital signs and the nursing notes.  Pertinent labs & imaging results that were available during my care of the patient were reviewed by me and considered in my medical decision making (see chart for details).    Discussed with patient the importance of repeat diagnostic mammogram to evaluate these breast masses, though the persistence and waxing and waning quality do cause suspicion for more hormonal etiology. B/l breast u/s's ordered per protocol additionally. Information given to patient regarding where to go/call to schedule these tests and also gave health dept information as patient requesting pap smear and other female screenings but is uninsured.   Final Clinical Impressions(s) / UC Diagnoses   Final diagnoses:  Breast pain  Breast mass  History of abnormal mammogram     Discharge Instructions     [facility]  - Breast Center Located in: [facility] Address: [address], [place], [place] [address] Hours:  Open ? Closes 5:20PM Phone: [phone]   [facility] Department for your female health screenings -  Address: [address] [place], [place] [address] Hours:  Open ? Closes 5PM Phone: [phone]    ED Prescriptions    None     PDMP not reviewed this encounter.   Particia Nearing, [place] 01/08/20 1204

## 2020-01-07 NOTE — Discharge Instructions (Signed)
[facility]  - Breast Center Located in: [facility] Address: [address], [place], [place] [address] Hours:  Open ? Closes 5:20PM Phone: [phone]   [facility] Department for your female health screenings -  Address: [address] [place], [place] [address] Hours:  Open ? Closes 5PM Phone: [phone]

## 2020-01-07 NOTE — ED Triage Notes (Signed)
Pt is here with bilateral breast pain with lumps that he noticed 12/22/2019, pt states she got a right breast biopsy done 2002 & she was suppose to go back & have continuous checks every 2 years.

## 2020-01-08 ENCOUNTER — Other Ambulatory Visit: Payer: Self-pay

## 2020-01-08 DIAGNOSIS — N644 Mastodynia: Secondary | ICD-10-CM

## 2020-01-08 DIAGNOSIS — N63 Unspecified lump in unspecified breast: Secondary | ICD-10-CM

## 2020-01-10 ENCOUNTER — Other Ambulatory Visit: Payer: Self-pay

## 2020-01-10 ENCOUNTER — Ambulatory Visit: Payer: Self-pay | Admitting: *Deleted

## 2020-01-10 VITALS — BP 116/78 | Temp 97.3°F | Wt 147.8 lb

## 2020-01-10 DIAGNOSIS — N644 Mastodynia: Secondary | ICD-10-CM

## 2020-01-10 DIAGNOSIS — N6315 Unspecified lump in the right breast, overlapping quadrants: Secondary | ICD-10-CM

## 2020-01-10 DIAGNOSIS — Z1239 Encounter for other screening for malignant neoplasm of breast: Secondary | ICD-10-CM

## 2020-01-10 DIAGNOSIS — N6323 Unspecified lump in the left breast, lower outer quadrant: Secondary | ICD-10-CM

## 2020-01-10 NOTE — Patient Instructions (Addendum)
Explained breast self awareness with [name]. Patient did not need a Pap smear today due to last Pap smear was 01/10/2020 per patient. Let her know [facility] will cover Pap smears every 3 years and Pap smears and HPV typing every 5 years  unless has a history of abnormal Pap smears. Referred patient to the [facility] of [facility] for a diagnostic mammogram. Appointment scheduled Tuesday, January 15, 2020 at 1400. Patient aware of appointment and will be there. [name] verbalized understanding.  Seana Underwood, Kathaleen Maser, RN 1:39 PM

## 2020-01-10 NOTE — Progress Notes (Addendum)
Ms. Tyshauna Finkbiner is a 44 y.o. female who presents to [facility] clinic today with complaint of bilateral breast lumps and pain. Patient stated she first noticed the left breast lump x weeks ago and the right breast lump in 2004. Patient complained of bilateral lower breast pain since December 21, 2019. Patient stated the pain is constant and rates the pain at a 9 out of 10.    Pap Smear: Pap smear not completed today. Last Pap smear was 01/10/2020 at the [facility] Department and patient has not received results. Per patient has no history of an abnormal Pap smear. No Pap smear results are in EPIC.  Physical exam: Breasts Breasts symmetrical. No skin abnormalities bilateral breasts. No nipple retraction bilateral breasts. No nipple discharge bilateral breasts. No lymphadenopathy. Palpated a lump right breast at 9 o'clock 4 cm from the nipple. Palpated a lump within the left breast at 5 o'clock 2 cm from the nipple. Complaints of bilateral diffuse breast pain on exam.       Pelvic/Bimanual Pap is not indicated today per [facility] guidelines.   Smoking History: Patient has never smoked.   Patient Navigation: Patient education provided. Access to services provided for patient through [facility] program.    Breast and Cervical Cancer Risk Assessment: Patient does not have family history of breast cancer, known genetic mutations, or radiation treatment to the chest before age 27. Patient does not have history of cervical dysplasia, immunocompromised, or DES exposure in-utero.  Risk Assessment    Risk Scores      01/10/2020   Last edited by: Meryl Dare, CMA   5-year risk: 1 %   Lifetime risk: 10.1 %          A: [facility] exam without pap smear Complaint of bilateral breast lumps and pain.  P: Referred patient to the [facility] for a diagnostic mammogram. Appointment scheduled Tuesday, January 15, 2020 at 1400.  Priscille Heidelberg, RN 01/10/2020 1:39 PM

## 2020-01-15 ENCOUNTER — Ambulatory Visit
Admission: RE | Admit: 2020-01-15 | Discharge: 2020-01-15 | Disposition: A | Discharge status: Home / Self Care | Payer: No Typology Code available for payment source | Source: Ambulatory Visit | Attending: Obstetrics and Gynecology | Admitting: Obstetrics and Gynecology

## 2020-01-15 ENCOUNTER — Ambulatory Visit
Admission: RE | Admit: 2020-01-15 | Discharge: 2020-01-15 | Disposition: A | Discharge status: Home / Self Care | Payer: Self-pay | Source: Ambulatory Visit | Attending: Obstetrics and Gynecology | Admitting: Obstetrics and Gynecology

## 2020-01-15 ENCOUNTER — Other Ambulatory Visit: Payer: Self-pay

## 2020-01-15 DIAGNOSIS — N644 Mastodynia: Secondary | ICD-10-CM

## 2020-01-15 DIAGNOSIS — N63 Unspecified lump in unspecified breast: Secondary | ICD-10-CM

## 2020-01-17 ENCOUNTER — Telehealth: Payer: Self-pay

## 2020-01-17 NOTE — Telephone Encounter (Signed)
Addressed see notes in Epic

## 2020-01-17 NOTE — Telephone Encounter (Signed)
Pt called states returning call from someone here at [facility] ph# missed call.   Pt thinks it may be results of Ultrasounds.

## 2020-02-15 ENCOUNTER — Ambulatory Visit: Payer: No Typology Code available for payment source

## 2020-02-18 ENCOUNTER — Ambulatory Visit: Payer: No Typology Code available for payment source

## 2020-02-22 ENCOUNTER — Ambulatory Visit: Payer: No Typology Code available for payment source | Admitting: Family Medicine

## 2020-08-12 ENCOUNTER — Other Ambulatory Visit: Payer: Self-pay

## 2020-08-12 ENCOUNTER — Ambulatory Visit (INDEPENDENT_AMBULATORY_CARE_PROVIDER_SITE_OTHER): Payer: Self-pay | Admitting: Family

## 2020-08-12 ENCOUNTER — Encounter: Payer: Self-pay | Admitting: Family

## 2020-08-12 VITALS — BP 95/64 | HR 78 | Ht 66.14 in | Wt 147.6 lb

## 2020-08-12 DIAGNOSIS — Z7689 Persons encountering health services in other specified circumstances: Secondary | ICD-10-CM

## 2020-08-12 DIAGNOSIS — Z789 Other specified health status: Secondary | ICD-10-CM

## 2020-08-12 NOTE — Progress Notes (Signed)
Establish care Experiencing fatigue for approx.year

## 2020-08-12 NOTE — Patient Instructions (Signed)
- Return for annual physical examination, labs, and health maintenance. Arrive fasting meaning having had no food and/or nothing to drink for at least 8 hours prior to appointment.  Please take scheduled medications as normal. Thank you for choosing Primary Care at [facility] for your medical home!    Alexandra Boyd was seen by Alexandra Fendt, NP today.   Alexandra Boyd's primary care provider is Alexandra Fendt, NP.   For the best care possible,  you should try to see Alexandra Stabs, NP whenever you come to clinic.   We look forward to seeing you again soon!  If you have any questions about your visit today,  please call Alexandra Boyd at [phone]  Or feel free to reach your provider via MyChart.     DASH Eating Plan DASH stands for Dietary Approaches to Stop Hypertension. The DASH eating plan is a healthy eating plan that has been shown to:  Reduce high blood pressure (hypertension).  Reduce your risk for type 2 diabetes, heart disease, and stroke.  Help with weight loss. What are tips for following this plan? Reading food labels  Check food labels for the amount of salt (sodium) per serving. Choose foods with less than 5 percent of the Daily Value of sodium. Generally, foods with less than 300 milligrams (mg) of sodium per serving fit into this eating plan.  To find whole grains, look for the word "whole" as the first word in the ingredient list. Shopping  Buy products labeled as "low-sodium" or "no salt added."  Buy fresh foods. Avoid canned foods and pre-made or frozen meals. Cooking  Avoid adding salt when cooking. Use salt-free seasonings or herbs instead of table salt or sea salt. Check with your health care provider or pharmacist before using salt substitutes.  Do not fry foods. Cook foods using healthy methods such as baking, boiling, grilling, roasting, and broiling instead.  Cook with heart-healthy oils, such as olive, canola, avocado, soybean, or sunflower  oil. Meal planning  Eat a balanced diet that includes: ? 4 or more servings of fruits and 4 or more servings of vegetables each day. Try to fill one-half of your plate with fruits and vegetables. ? 6-8 servings of whole grains each day. ? Less than 6 oz (170 g) of lean meat, poultry, or fish each day. A 3-oz (85-g) serving of meat is about the same size as a deck of cards. One egg equals 1 oz (28 g). ? 2-3 servings of low-fat dairy each day. One serving is 1 cup (237 mL). ? 1 serving of nuts, seeds, or beans 5 times each week. ? 2-3 servings of heart-healthy fats. Healthy fats called omega-3 fatty acids are found in foods such as walnuts, flaxseeds, fortified milks, and eggs. These fats are also found in cold-water fish, such as sardines, salmon, and mackerel.  Limit how much you eat of: ? Canned or prepackaged foods. ? Food that is high in trans fat, such as some fried foods. ? Food that is high in saturated fat, such as fatty meat. ? Desserts and other sweets, sugary drinks, and other foods with added sugar. ? Full-fat dairy products.  Do not salt foods before eating.  Do not eat more than 4 egg yolks a week.  Try to eat at least 2 vegetarian meals a week.  Eat more home-cooked food and less restaurant, buffet, and fast food.   Lifestyle  When eating at a restaurant, ask that your food be prepared  with less salt or no salt, if possible.  If you drink alcohol: ? Limit how much you use to:  0-1 drink a day for women who are not pregnant.  0-2 drinks a day for men. ? Be aware of how much alcohol is in your drink. In the [place] one drink equals one 12 oz bottle of beer (355 mL), one 5 oz glass of wine (148 mL), or one 1 oz glass of hard liquor (44 mL). General information  Avoid eating more than 2,300 mg of salt a day. If you have hypertension, you may need to reduce your sodium intake to 1,500 mg a day.  Work with your health care provider to maintain a healthy body weight or  to lose weight. Ask what an ideal weight is for you.  Get at least 30 minutes of exercise that causes your heart to beat faster (aerobic exercise) most days of the week. Activities may include walking, swimming, or biking.  Work with your health care provider or dietitian to adjust your eating plan to your individual calorie needs. What foods should I eat? Fruits All fresh, dried, or frozen fruit. Canned fruit in natural juice (without added sugar). Vegetables Fresh or frozen vegetables (raw, steamed, roasted, or grilled). Low-sodium or reduced-sodium tomato and vegetable juice. Low-sodium or reduced-sodium tomato sauce and tomato paste. Low-sodium or reduced-sodium canned vegetables. Grains Whole-grain or whole-wheat bread. Whole-grain or whole-wheat pasta. Brown rice. [place]. Bulgur. Whole-grain and low-sodium cereals. Pita bread. Low-fat, low-sodium crackers. Whole-wheat flour tortillas. Meats and other proteins Skinless chicken or [place]. Ground chicken or [place]. Pork with fat trimmed off. Fish and seafood. Egg whites. Dried beans, peas, or lentils. Unsalted nuts, nut butters, and seeds. Unsalted canned beans. Lean cuts of beef with fat trimmed off. Low-sodium, lean precooked or cured meat, such as sausages or meat loaves. Dairy Low-fat (1%) or fat-free (skim) milk. Reduced-fat, low-fat, or fat-free cheeses. Nonfat, low-sodium ricotta or cottage cheese. Low-fat or nonfat yogurt. Low-fat, low-sodium cheese. Fats and oils Soft margarine without trans fats. Vegetable oil. Reduced-fat, low-fat, or light mayonnaise and salad dressings (reduced-sodium). Canola, safflower, olive, avocado, soybean, and sunflower oils. Avocado. Seasonings and condiments Herbs. Spices. Seasoning mixes without salt. Other foods Unsalted popcorn and pretzels. Fat-free sweets. The items listed above may not be a complete list of foods and beverages you can eat. Contact a dietitian for more information. What  foods should I avoid? Fruits Canned fruit in a light or heavy syrup. Fried fruit. Fruit in cream or butter sauce. Vegetables Creamed or fried vegetables. Vegetables in a cheese sauce. Regular canned vegetables (not low-sodium or reduced-sodium). Regular canned tomato sauce and paste (not low-sodium or reduced-sodium). Regular tomato and vegetable juice (not low-sodium or reduced-sodium). Angie Fava. Olives. Grains Baked goods made with fat, such as croissants, muffins, or some breads. Dry pasta or rice meal packs. Meats and other proteins Fatty cuts of meat. Ribs. Fried meat. Berniece Salines. Bologna, salami, and other precooked or cured meats, such as sausages or meat loaves. Fat from the back of a pig (fatback). Bratwurst. Salted nuts and seeds. Canned beans with added salt. Canned or smoked fish. Whole eggs or egg yolks. Chicken or [place] with skin. Dairy Whole or 2% milk, cream, and half-and-half. Whole or full-fat cream cheese. Whole-fat or sweetened yogurt. Full-fat cheese. Nondairy creamers. Whipped toppings. Processed cheese and cheese spreads. Fats and oils Butter. Stick margarine. Lard. Shortening. Ghee. Bacon fat. Tropical oils, such as coconut, palm kernel, or palm oil. Seasonings and condiments  Onion salt, garlic salt, seasoned salt, table salt, and sea salt. Worcestershire sauce. Tartar sauce. Barbecue sauce. Teriyaki sauce. Soy sauce, including reduced-sodium. Steak sauce. Canned and packaged gravies. Fish sauce. Oyster sauce. Cocktail sauce. Store-bought horseradish. Ketchup. Mustard. Meat flavorings and tenderizers. Bouillon cubes. Hot sauces. Pre-made or packaged marinades. Pre-made or packaged taco seasonings. Relishes. Regular salad dressings. Other foods Salted popcorn and pretzels. The items listed above may not be a complete list of foods and beverages you should avoid. Contact a dietitian for more information. Where to find more information  National Heart, Lung, and Blood Institute:  https://wilson-eaton.com/  American Heart Association: www.heart.org  Academy of Nutrition and Dietetics: www.eatright.[place]: www.kidney.org Summary  The DASH eating plan is a healthy eating plan that has been shown to reduce high blood pressure (hypertension). It may also reduce your risk for type 2 diabetes, heart disease, and stroke.  When on the DASH eating plan, aim to eat more fresh fruits and vegetables, whole grains, lean proteins, low-fat dairy, and heart-healthy fats.  With the DASH eating plan, you should limit salt (sodium) intake to 2,300 mg a day. If you have hypertension, you may need to reduce your sodium intake to 1,500 mg a day.  Work with your health care provider or dietitian to adjust your eating plan to your individual calorie needs. This information is not intended to replace advice given to you by your health care provider. Make sure you discuss any questions you have with your health care provider. Document Revised: 03/30/2019 Document Reviewed: 03/30/2019 Elsevier Patient Education  2021 [employer].

## 2020-08-12 NOTE — Progress Notes (Signed)
Subjective:    Alexandra Boyd - 45 y.o. female MRN [id number]  Date of birth: [birth year]  HPI  Alexandra Boyd is to establish care. Patient has a PMH significant for none.    Current issues and/or concerns: Concern for fatigue, low energy, and sleepiness. Reports she eats a balanced diet, using Herbalife and lost 25 pounds. Sometimes feeling bloated/constipated and taking fiber for this.  Recently got over depression and still going to Psychiatry sessions. Was taking Prozac but no longer taking since doing well. Also, reports she did not want to become dependent on the medication.    Reports bilateral benign breast masses. Was told this could be hormone-related.  ROS per HPI   Health Maintenance:  Health Maintenance Due  Topic Date Due  . Hepatitis C Screening  Never done  . COVID-19 Vaccine (1) Never done  . HIV Screening  Never done  . TETANUS/TDAP  Never done    Past Medical History: There are no problems to display for this patient.  Social History   reports that she has never smoked. She has never used smokeless tobacco. She reports current alcohol use. She reports that she does not use drugs.   Family History  family history includes Hypertension in her mother; Stomach cancer in her father.   Medications: reviewed and updated   Objective:   Physical Exam BP 95/64 (BP Location: Right Arm, Patient Position: Sitting)   Pulse 78   Ht 5' 6.14" (1.68 m)   Wt 147 lb 9.6 oz (67 kg)   SpO2 98%   BMI 23.72 kg/m  Physical Exam HENT:     Head: Normocephalic and atraumatic.  Eyes:     Extraocular Movements: Extraocular movements intact.     Conjunctiva/sclera: Conjunctivae normal.     Pupils: Pupils are equal, round, and reactive to light.  Cardiovascular:     Rate and Rhythm: Normal rate and regular rhythm.     Pulses: Normal pulses.     Heart sounds: Normal heart sounds.  Pulmonary:     Effort: Pulmonary effort is normal.     Breath sounds: Normal  breath sounds.  Musculoskeletal:     Cervical back: Normal range of motion and neck supple.  Neurological:     General: No focal deficit present.     Mental Status: She is alert and oriented to person, place, and time.  Psychiatric:        Mood and Affect: Mood normal.        Behavior: Behavior normal.      Assessment & Plan:  1. Encounter to establish care: - Patient presents today to establish care.  - Return for annual physical examination, labs, and health maintenance. Arrive fasting meaning having had no food and/or nothing to drink for at least 8 hours prior to appointment.  Please take scheduled medications as normal.  2. Language barrier: - Patient declined interpreter. She is fluent in [place].    Patient was given clear instructions to go to Emergency Department or return to medical center if symptoms don't improve, worsen, or new problems develop.The patient verbalized understanding.  I discussed the assessment and treatment plan with the patient. The patient was provided an opportunity to ask questions and all were answered. The patient agreed with the plan and demonstrated an understanding of the instructions.   The patient was advised to call back or seek an in-person evaluation if the symptoms worsen or if the condition fails to improve as anticipated.  Ricky Stabs, NP 08/14/2020, 5:31 PM Primary Care at [facility]

## 2020-09-03 ENCOUNTER — Ambulatory Visit: Payer: No Typology Code available for payment source

## 2020-09-15 NOTE — Progress Notes (Signed)
Patient ID: Alexandra Boyd, female    DOB: [birth year]  MRN: [id number]  CC: Annual Physical Exam  Subjective: Alexandra Boyd is a 45 y.o. female who presents for annual physical exam. Her concerns today include: None.  There are no problems to display for this patient.    Current Outpatient Medications on File Prior to Visit  Medication Sig Dispense Refill  . EPINEPHrine (EPIPEN 2-PAK) 0.3 mg/0.3 mL IJ SOAJ injection Inject 0.3 mLs (0.3 mg total) into the muscle as needed for anaphylaxis. /severe allergic reaction (Patient not taking: Reported on 01/10/2020) 1 Device 11  . polyethylene glycol (MIRALAX) packet Take 17 g by mouth daily. Mixed with at least 8 ounces of water as needed for constipation 30 each 3   No current facility-administered medications on file prior to visit.    Allergies  Allergen Reactions  . Fruit & Vegetable Daily [Nutritional Supplements] Swelling    Apples- cause throat swelling  . Sulfa Antibiotics Other (See Comments)    Social History   Socioeconomic History  . Marital status: Single    Spouse name: Not on file  . Number of children: Not on file  . Years of education: Not on file  . Highest education level: 12th grade  Occupational History  . Not on file  Tobacco Use  . Smoking status: Never Smoker  . Smokeless tobacco: Never Used  Vaping Use  . Vaping Use: Never used  Substance and Sexual Activity  . Alcohol use: Yes    Comment: OCC  . Drug use: No  . Sexual activity: Yes    Partners: Male    Birth control/protection: None  Other Topics Concern  . Not on file  Social History Narrative  . Not on file   Social Determinants of Health   Financial Resource Strain: Not on file  Food Insecurity: Not on file  Transportation Needs: No Transportation Needs  . Lack of Transportation (Medical): No  . Lack of Transportation (Non-Medical): No  Physical Activity: Not on file  Stress: Not on file  Social Connections: Not on file   Intimate Partner Violence: Not on file    Family History  Problem Relation Age of Onset  . Stomach cancer Father   . Hypertension Mother     Past Surgical History:  Procedure Laterality Date  . APPENDECTOMY    . BREAST BIOPSY Right     ROS: Review of Systems Negative except as stated above  PHYSICAL EXAM: BP 100/69 (BP Location: Left Arm, Patient Position: Sitting, Cuff Size: Normal)   Pulse 68   Temp 97.7 F (36.5 C)   Resp 18   Ht 5' 6.14" (1.68 m)   Wt 147 lb 3.2 oz (66.8 kg)   SpO2 98%   BMI 23.66 kg/m    Wt Readings from Last 3 Encounters:  09/16/20 147 lb 3.2 oz (66.8 kg)  08/12/20 147 lb 9.6 oz (67 kg)  01/10/20 147 lb 12.8 oz (67 kg)    Physical Exam HENT:     Head: Normocephalic and atraumatic.     Right Ear: Tympanic membrane, ear canal and external ear normal.     Left Ear: Tympanic membrane, ear canal and external ear normal.     Nose: Nose normal.     Mouth/Throat:     Mouth: Mucous membranes are moist.     Pharynx: Oropharynx is clear.  Eyes:     Extraocular Movements: Extraocular movements intact.     Conjunctiva/sclera: Conjunctivae normal.  Pupils: Pupils are equal, round, and reactive to light.  Cardiovascular:     Rate and Rhythm: Normal rate and regular rhythm.     Pulses: Normal pulses.     Heart sounds: Normal heart sounds.  Pulmonary:     Effort: Pulmonary effort is normal.     Breath sounds: Normal breath sounds.  Chest:     Comments: Patient declined examination. Abdominal:     General: Bowel sounds are normal.     Palpations: Abdomen is soft.  Genitourinary:    Comments: Patient declined examination.  Musculoskeletal:        General: Normal range of motion.     Cervical back: Normal range of motion and neck supple.  Skin:    General: Skin is warm and dry.     Capillary Refill: Capillary refill takes less than 2 seconds.  Neurological:     General: No focal deficit present.     Mental Status: She is alert and  oriented to person, place, and time.  Psychiatric:        Mood and Affect: Mood normal.        Behavior: Behavior normal.     ASSESSMENT AND PLAN: 1. Annual physical exam: - Counseled on 150 minutes of exercise per week as tolerated, healthy eating (including decreased daily intake of saturated fats, cholesterol, added sugars, sodium), STI prevention, and routine healthcare maintenance.  2. Screening for metabolic disorder: - CMP to check kidney function, liver function, and electrolyte balance.  - Comprehensive metabolic panel  3. Screening for deficiency anemia: - CBC to screen for anemia. - CBC  4. Diabetes mellitus screening: - Hemoglobin A1c to screen for pre-diabetes/diabetes. - Hemoglobin A1c  5. Screening cholesterol level: - Lipid panel to screen for high cholesterol.  - Lipid panel  6. Thyroid disorder screen: - TSH to check thyroid function.  - TSH+T4F+T3Free  7. Need for hepatitis C screening test: - Hepatitis C antibody to screen for hepatitis C.  - Hepatitis C Antibody  8. Encounter for screening for HIV: - HIV antibody to screen for human immunodeficiency virus.  - HIV antibody (with reflex)  9. Encounter for vitamin deficiency screening: - Vitamin D, 25-hydroxy to screen for deficiency. - Vitamin D, 25-hydroxy  10. Language barrier: - Patient speaking English fluently.   Patient was given the opportunity to ask questions.  Patient verbalized understanding of the plan and was able to repeat key elements of the plan. Patient was given clear instructions to go to Emergency Department or return to medical center if symptoms don't improve, worsen, or new problems develop.The patient verbalized understanding.   Orders Placed This Encounter  Procedures  . HIV antibody (with reflex)  . Hepatitis C Antibody  . CBC  . Comprehensive metabolic panel  . Lipid panel  . TSH+T4F+T3Free  . Hemoglobin A1c  . Vitamin D, 25-hydroxy    Requested Prescriptions     No prescriptions requested or ordered in this encounter   Follow-up with primary provider as scheduled.   Rema Fendt, NP

## 2020-09-16 ENCOUNTER — Other Ambulatory Visit: Payer: Self-pay

## 2020-09-16 ENCOUNTER — Encounter: Payer: Self-pay | Admitting: Family

## 2020-09-16 ENCOUNTER — Ambulatory Visit (INDEPENDENT_AMBULATORY_CARE_PROVIDER_SITE_OTHER): Payer: Self-pay | Admitting: Family

## 2020-09-16 VITALS — BP 100/69 | HR 68 | Temp 97.7°F | Resp 18 | Ht 66.14 in | Wt 147.2 lb

## 2020-09-16 DIAGNOSIS — Z114 Encounter for screening for human immunodeficiency virus [HIV]: Secondary | ICD-10-CM

## 2020-09-16 DIAGNOSIS — Z131 Encounter for screening for diabetes mellitus: Secondary | ICD-10-CM

## 2020-09-16 DIAGNOSIS — Z1321 Encounter for screening for nutritional disorder: Secondary | ICD-10-CM

## 2020-09-16 DIAGNOSIS — Z1329 Encounter for screening for other suspected endocrine disorder: Secondary | ICD-10-CM

## 2020-09-16 DIAGNOSIS — Z13220 Encounter for screening for lipoid disorders: Secondary | ICD-10-CM

## 2020-09-16 DIAGNOSIS — Z789 Other specified health status: Secondary | ICD-10-CM

## 2020-09-16 DIAGNOSIS — Z130 Encounter for screening for diseases of the blood and blood-forming organs and certain disorders involving the immune mechanism: Secondary | ICD-10-CM

## 2020-09-16 DIAGNOSIS — Z0000 Encounter for general adult medical examination without abnormal findings: Secondary | ICD-10-CM

## 2020-09-16 DIAGNOSIS — Z1159 Encounter for screening for other viral diseases: Secondary | ICD-10-CM

## 2020-09-16 DIAGNOSIS — Z13228 Encounter for screening for other metabolic disorders: Secondary | ICD-10-CM

## 2020-09-16 NOTE — Progress Notes (Signed)
Physical

## 2020-09-16 NOTE — Patient Instructions (Signed)
Preventive Care 84-44 Years Old, Female Preventive care refers to lifestyle choices and visits with your health care provider that can promote health and wellness. This includes:  A yearly physical exam. This is also called an annual wellness visit.  Regular dental and eye exams.  Immunizations.  Screening for certain conditions.  Healthy lifestyle choices, such as: ? Eating a healthy diet. ? Getting regular exercise. ? Not using drugs or products that contain nicotine and tobacco. ? Limiting alcohol use. What can I expect for my preventive care visit? Physical exam Your health care provider will check your:  Height and weight. These may be used to calculate your BMI (body mass index). BMI is a measurement that tells if you are at a healthy weight.  Heart rate and blood pressure.  Body temperature.  Skin for abnormal spots. Counseling Your health care provider may ask you questions about your:  Past medical problems.  Family's medical history.  Alcohol, tobacco, and drug use.  Emotional well-being.  Home life and relationship well-being.  Sexual activity.  Diet, exercise, and sleep habits.  Work and work Statistician.  Access to firearms.  Method of birth control.  Menstrual cycle.  Pregnancy history. What immunizations do I need? Vaccines are usually given at various ages, according to a schedule. Your health care provider will recommend vaccines for you based on your age, medical history, and lifestyle or other factors, such as travel or where you work.   What tests do I need? Blood tests  Lipid and cholesterol levels. These may be checked every 5 years, or more often if you are over 3 years old.  Hepatitis C test.  Hepatitis B test. Screening  Lung cancer screening. You may have this screening every year starting at age 73 if you have a 30-pack-year history of smoking and currently smoke or have quit within the past 15 years.  Colorectal cancer  screening. ? All adults should have this screening starting at age 52 and continuing until age 17. ? Your health care provider may recommend screening at age 49 if you are at increased risk. ? You will have tests every 1-10 years, depending on your results and the type of screening test.  Diabetes screening. ? This is done by checking your blood sugar (glucose) after you have not eaten for a while (fasting). ? You may have this done every 1-3 years.  Mammogram. ? This may be done every 1-2 years. ? Talk with your health care provider about when you should start having regular mammograms. This may depend on whether you have a family history of breast cancer.  BRCA-related cancer screening. This may be done if you have a family history of breast, ovarian, tubal, or peritoneal cancers.  Pelvic exam and Pap test. ? This may be done every 3 years starting at age 10. ? Starting at age 11, this may be done every 5 years if you have a Pap test in combination with an HPV test. Other tests  STD (sexually transmitted disease) testing, if you are at risk.  Bone density scan. This is done to screen for osteoporosis. You may have this scan if you are at high risk for osteoporosis. Talk with your health care provider about your test results, treatment options, and if necessary, the need for more tests. Follow these instructions at home: Eating and drinking  Eat a diet that includes fresh fruits and vegetables, whole grains, lean protein, and low-fat dairy products.  Take vitamin and mineral supplements  as recommended by your health care provider.  Do not drink alcohol if: ? Your health care provider tells you not to drink. ? You are pregnant, may be pregnant, or are planning to become pregnant.  If you drink alcohol: ? Limit how much you have to 0-1 drink a day. ? Be aware of how much alcohol is in your drink. In the [place] one drink equals one 12 oz bottle of beer (355 mL), one 5 oz glass of  wine (148 mL), or one 1 oz glass of hard liquor (44 mL).   Lifestyle  Take daily care of your teeth and gums. Brush your teeth every morning and night with fluoride toothpaste. Floss one time each day.  Stay active. Exercise for at least 30 minutes 5 or more days each week.  Do not use any products that contain nicotine or tobacco, such as cigarettes, e-cigarettes, and chewing tobacco. If you need help quitting, ask your health care provider.  Do not use drugs.  If you are sexually active, practice safe sex. Use a condom or other form of protection to prevent STIs (sexually transmitted infections).  If you do not wish to become pregnant, use a form of birth control. If you plan to become pregnant, see your health care provider for a prepregnancy visit.  If told by your health care provider, take low-dose aspirin daily starting at age 50.  Find healthy ways to cope with stress, such as: ? Meditation, yoga, or listening to music. ? Journaling. ? Talking to a trusted person. ? Spending time with friends and family. Safety  Always wear your seat belt while driving or riding in a vehicle.  Do not drive: ? If you have been drinking alcohol. Do not ride with someone who has been drinking. ? When you are tired or distracted. ? While texting.  Wear a helmet and other protective equipment during sports activities.  If you have firearms in your house, make sure you follow all gun safety procedures. What's next?  Visit your health care provider once a year for an annual wellness visit.  Ask your health care provider how often you should have your eyes and teeth checked.  Stay up to date on all vaccines. This information is not intended to replace advice given to you by your health care provider. Make sure you discuss any questions you have with your health care provider. Document Revised: 01/29/2020 Document Reviewed: 01/05/2018 Elsevier Patient Education  2021 Elsevier Inc.  

## 2020-09-17 LAB — LIPID PANEL
Chol/HDL Ratio: 1.9 ratio (ref 0.0–4.4)
Cholesterol, Total: 151 mg/dL (ref 100–199)
HDL: 80 mg/dL (ref >39)
LDL Chol Calc (NIH): 59 mg/dL (ref 0–99)
Triglycerides: 56 mg/dL (ref 0–149)
VLDL Cholesterol Cal: 12 mg/dL (ref 5–40)

## 2020-09-17 LAB — COMPREHENSIVE METABOLIC PANEL
ALT: 21 IU/L (ref 0–32)
AST: 21 IU/L (ref 0–40)
Albumin/Globulin Ratio: 1.6 (ref 1.2–2.2)
Albumin: 4.5 g/dL (ref 3.8–4.8)
Alkaline Phosphatase: 74 IU/L (ref 44–121)
BUN/Creatinine Ratio: 22 (ref 9–23)
BUN: 15 mg/dL (ref 6–24)
Bilirubin Total: 0.3 mg/dL (ref 0.0–1.2)
CO2: 23 mmol/L (ref 20–29)
Calcium: 9.2 mg/dL (ref 8.7–10.2)
Chloride: 102 mmol/L (ref 96–106)
Creatinine, Ser: 0.67 mg/dL (ref 0.57–1.00)
Globulin, Total: 2.8 g/dL (ref 1.5–4.5)
Glucose: 87 mg/dL (ref 65–99)
Potassium: 4.8 mmol/L (ref 3.5–5.2)
Sodium: 140 mmol/L (ref 134–144)
Total Protein: 7.3 g/dL (ref 6.0–8.5)
eGFR: 111 mL/min/{1.73_m2} (ref >59)

## 2020-09-17 LAB — CBC
Hematocrit: 39.6 % (ref 34.0–46.6)
Hemoglobin: 12.7 g/dL (ref 11.1–15.9)
MCH: 28.7 pg (ref 26.6–33.0)
MCHC: 32.1 g/dL (ref 31.5–35.7)
MCV: 89 fL (ref 79–97)
Platelets: 245 10*3/uL (ref 150–450)
RBC: 4.43 x10E6/uL (ref 3.77–5.28)
RDW: 14 % (ref 11.7–15.4)
WBC: 5.9 10*3/uL (ref 3.4–10.8)

## 2020-09-17 LAB — HEMOGLOBIN A1C
Est. average glucose Bld gHb Est-mCnc: 111 mg/dL
Hgb A1c MFr Bld: 5.5 % (ref 4.8–5.6)

## 2020-09-17 LAB — TSH+T4F+T3FREE
Free T4: 1.04 ng/dL (ref 0.82–1.77)
T3, Free: 2.6 pg/mL (ref 2.0–4.4)
TSH: 1.53 u[IU]/mL (ref 0.450–4.500)

## 2020-09-17 LAB — HEPATITIS C ANTIBODY: Hep C Virus Ab: <0.1 s/co ratio (ref 0.0–0.9)

## 2020-09-17 LAB — HIV ANTIBODY (ROUTINE TESTING W REFLEX): HIV Screen 4th Generation wRfx: NONREACTIVE

## 2020-09-17 NOTE — Progress Notes (Signed)
Kidney function normal.   Liver function normal.   Thyroid function normal.   Cholesterol normal.   No diabetes.   No anemia.   Hepatitis C negative.   HIV negative.

## 2020-09-20 ENCOUNTER — Other Ambulatory Visit: Payer: Self-pay | Admitting: Family

## 2020-09-20 DIAGNOSIS — E559 Vitamin D deficiency, unspecified: Secondary | ICD-10-CM

## 2020-09-20 LAB — SPECIMEN STATUS REPORT

## 2020-09-20 LAB — VITAMIN D 25 HYDROXY (VIT D DEFICIENCY, FRACTURES): Vit D, 25-Hydroxy: 25.4 ng/mL — ABNORMAL LOW (ref 30.0–100.0)

## 2020-09-20 MED ORDER — VITAMIN D3 25 MCG (1000 UT) PO CAPS: 1000.0000 [IU] | ORAL_CAPSULE | Freq: Every day | ORAL | 0 refills | Status: AC

## 2020-09-20 NOTE — Progress Notes (Signed)
Vitamin D low. Begin daily Cholecalciferol (Vitamin D3) for 12 weeks. Patient encouraged to have rechecked in 12 weeks.

## 2020-09-23 ENCOUNTER — Encounter: Payer: Self-pay | Admitting: Family

## 2020-12-04 ENCOUNTER — Ambulatory Visit
Admission: EM | Admit: 2020-12-04 | Discharge: 2020-12-04 | Disposition: A | Discharge status: Home / Self Care | Payer: Self-pay | Attending: Urgent Care | Admitting: Urgent Care

## 2020-12-04 ENCOUNTER — Other Ambulatory Visit: Payer: Self-pay

## 2020-12-04 DIAGNOSIS — L237 Allergic contact dermatitis due to plants, except food: Secondary | ICD-10-CM

## 2020-12-04 MED ORDER — HYDROXYZINE HCL 25 MG PO TABS: 12.5000 mg | ORAL_TABLET | Freq: Three times a day (TID) | ORAL | 0 refills | Status: DC | PRN

## 2020-12-04 MED ORDER — PREDNISONE 20 MG PO TABS: ORAL_TABLET | ORAL | 0 refills | Status: DC

## 2020-12-04 NOTE — Discharge Instructions (Addendum)
Prednisona  Dia 1-5: Tome 3 pastillas al dia. Dia 6-10: Tome 2 pastillas al dia. Dia 11-15: Tome 1 pastilla al dia. Tome su medicamento con desayuno.

## 2020-12-04 NOTE — ED Provider Notes (Signed)
[facility]   MRN: [id number] DOB: 1975/08/09  Subjective:   Alexandra Boyd is a 45 y.o. female presenting for 3-day history of acute onset rash over her right upper arm, thighs, ankles, buttock area and chest/breasts.  Patient came into contact with poison ivy and would like to be treated for this.  Denies fever, difficulty breathing, chest tightness, nausea, vomiting, abdominal pain.  Has used over-the-counter creams with minimal relief.  No current facility-administered medications for this encounter.  Current Outpatient Medications:    Cholecalciferol (VITAMIN D3) 25 MCG (1000 UT) CAPS, Take 1 capsule (1,000 Units total) by mouth daily., Disp: 120 capsule, Rfl: 0   EPINEPHrine (EPIPEN 2-PAK) 0.3 mg/0.3 mL IJ SOAJ injection, Inject 0.3 mLs (0.3 mg total) into the muscle as needed for anaphylaxis. /severe allergic reaction (Patient not taking: Reported on 01/10/2020), Disp: 1 Device, Rfl: 11   polyethylene glycol (MIRALAX) packet, Take 17 g by mouth daily. Mixed with at least 8 ounces of water as needed for constipation, Disp: 30 each, Rfl: 3   Allergies  Allergen Reactions   Fruit & Vegetable Daily [Nutritional Supplements] Swelling    Apples- cause throat swelling   Sulfa Antibiotics Other (See Comments)    No past medical history on file.   Past Surgical History:  Procedure Laterality Date   APPENDECTOMY     BREAST BIOPSY Right     Family History  Problem Relation Age of Onset   Stomach cancer Father    Hypertension Mother     Social History   Tobacco Use   Smoking status: Never   Smokeless tobacco: Never  Vaping Use   Vaping Use: Never used  Substance Use Topics   Alcohol use: Yes    Comment: OCC   Drug use: No    ROS   Objective:   Vitals: BP 96/65 (BP Location: Left Arm)   Pulse 77   Temp 98.9 F (37.2 C) (Oral)   Resp 18   SpO2 97%   Physical Exam Constitutional:      General: She is not in acute distress.    Appearance:  Normal appearance. She is well-developed. She is not ill-appearing, toxic-appearing or diaphoretic.  HENT:     Head: Normocephalic and atraumatic.     Nose: Nose normal.     Mouth/Throat:     Mouth: Mucous membranes are moist.     Pharynx: Oropharynx is clear.  Eyes:     General: No scleral icterus.       Right eye: No discharge.        Left eye: No discharge.     Extraocular Movements: Extraocular movements intact.     Conjunctiva/sclera: Conjunctivae normal.     Pupils: Pupils are equal, round, and reactive to light.  Cardiovascular:     Rate and Rhythm: Normal rate.  Pulmonary:     Effort: Pulmonary effort is normal.  Skin:    General: Skin is warm and dry.     Findings: Rash (Large patches of vesicular lesions with associated excoriations over the right upper arm, posterior thighs and buttock area, to a lesser extent over the ankles) present.  Neurological:     General: No focal deficit present.     Mental Status: She is alert and oriented to person, place, and time.  Psychiatric:        Mood and Affect: Mood normal.        Behavior: Behavior normal.        Thought Content:  Thought content normal.        Judgment: Judgment normal.    Assessment and Plan :   PDMP not reviewed this encounter.  1. Poison ivy dermatitis     We will use a 15-day steroid course to address contact dermatitis due to poison ivy.  Use Vistaril for severe itching. Counseled patient on potential for adverse effects with medications prescribed/recommended today, ER and return-to-clinic precautions discussed, patient verbalized understanding.    Wallis Bamberg, PA-C 12/04/20 1248

## 2020-12-04 NOTE — ED Triage Notes (Signed)
Pt c/o poison ivy to rt upper arm, hands, legs, and buttocks x3 days. Blistering noted to rt upper arm.

## 2020-12-24 ENCOUNTER — Other Ambulatory Visit: Payer: Self-pay | Admitting: Family

## 2020-12-24 DIAGNOSIS — Z1231 Encounter for screening mammogram for malignant neoplasm of breast: Secondary | ICD-10-CM

## 2020-12-29 ENCOUNTER — Other Ambulatory Visit: Payer: Self-pay

## 2020-12-29 ENCOUNTER — Telehealth (INDEPENDENT_AMBULATORY_CARE_PROVIDER_SITE_OTHER): Payer: Self-pay | Admitting: Nurse Practitioner

## 2020-12-29 ENCOUNTER — Encounter: Payer: Self-pay | Admitting: Nurse Practitioner

## 2020-12-29 DIAGNOSIS — L237 Allergic contact dermatitis due to plants, except food: Secondary | ICD-10-CM

## 2020-12-29 MED ORDER — METHYLPREDNISOLONE SODIUM SUCC 125 MG IJ SOLR
80.0000 mg | Freq: Once | INTRAMUSCULAR | Status: AC
  Administered 2020-12-29: 80 mg via INTRAMUSCULAR

## 2020-12-29 MED ORDER — METHYLPREDNISOLONE ACETATE 80 MG/ML IJ SUSP: 80.0000 mg | Freq: Once | INTRAMUSCULAR | Status: DC

## 2020-12-29 NOTE — Patient Instructions (Signed)
Dermatitis due to poison ivy:  Will order solu medrol injection - nurse visit scheduled for today  Do not scratch or rub your skin. Put a cold, wet cloth (cold compress) on the affected areas or take baths in cool water. This will help with itching. Avoid hot baths and showers. Take oatmeal baths as needed. Use colloidal oatmeal. You can get this at a pharmacy or grocery store. Follow the instructions on the package. While you have the rash, wash your clothes right after you wear them.  Follow up:  Please go to the ED if symptoms worsen  Poison Ivy Dermatitis Poison ivy dermatitis is redness and soreness of the skin caused by chemicals in the leaves of the poison ivy plant. You may have very bad itching, swelling,a rash, and blisters. What are the causes? Touching a poison ivy plant. Touching something that has the chemical on it. This may include animals or objects that have come in contact with the plant. What increases the risk? Going outdoors often in wooded or [place] areas. Going outdoors without wearing protective clothing, such as closed shoes, long pants, and a long-sleeved shirt. What are the signs or symptoms?  Skin redness. Very bad itching. A rash that often includes bumps and blisters. The rash usually appears 48 hours after exposure, if you have been exposed before. If this is the first time you have been exposed, the rash may not appear until a week after exposure. Swelling. This may occur if the reaction is very bad. Symptoms usually last for 1-2 weeks. The first time you develop this condition,symptoms may last 3-4 weeks. How is this treated? This condition may be treated with: Hydrocortisone cream or calamine lotion to relieve itching. Oatmeal baths to soothe the skin. Medicines, such as over-the-counter antihistamine tablets. Oral steroid medicine for more severe reactions. Follow these instructions at home: Medicines Take or apply over-the-counter and  prescription medicines only as told by your doctor. Use hydrocortisone cream or calamine lotion as needed to help with itching. General instructions Do not scratch or rub your skin. Put a cold, wet cloth (cold compress) on the affected areas or take baths in cool water. This will help with itching. Avoid hot baths and showers. Take oatmeal baths as needed. Use colloidal oatmeal. You can get this at a pharmacy or grocery store. Follow the instructions on the package. While you have the rash, wash your clothes right after you wear them. Keep all follow-up visits as told by your health care provider. This is important. How is this prevented?  Know what poison ivy looks like, so you can avoid it. This plant has three leaves with flowering branches on a single stem. The leaves are glossy. The leaves have uneven edges that come to a point at the front. If you touch poison ivy, wash your skin with soap and water right away. Be sure to wash under your fingernails. When hiking or camping, wear long pants, a long-sleeved shirt, tall socks, and hiking boots. You can also use a lotion on your skin that helps to prevent contact with poison ivy. If you think that your clothes or outdoor gear came in contact with poison ivy, rinse them off with a garden hose before you bring them inside your house. When doing yard work or gardening, wear gloves, long sleeves, long pants, and boots. Wash your garden tools and gloves if they come in contact with poison ivy. If you think that your pet has come into contact with poison ivy,  wash him or her with pet shampoo and water. Make sure to wear gloves while washing your pet.  Contact a doctor if: You have open sores in the rash area. You have more redness, swelling, or pain in the rash area. You have redness that spreads beyond the rash area. You have fluid, blood, or pus coming from the rash area. You have a fever. You have a rash over a large area of your body. You  have a rash on your eyes, mouth, or genitals. Your rash does not get better after a few weeks. Get help right away if: Your face swells or your eyes swell shut. You have trouble breathing. You have trouble swallowing. These symptoms may be an emergency. Do not wait to see if the symptoms will go away. Get medical help right away. Call your local emergency services (911 in the [place] Do not drive yourself to the hospital. Summary Poison ivy dermatitis is redness and soreness of the skin caused by chemicals in the leaves of the poison ivy plant. You may have skin redness, very bad itching, swelling, and a rash. Do not scratch or rub your skin. Take or apply over-the-counter and prescription medicines only as told by your doctor. This information is not intended to replace advice given to you by your health care provider. Make sure you discuss any questions you have with your healthcare provider. Document Revised: 08/18/2018 Document Reviewed: 04/21/2018 Elsevier Patient Education  2022 [employer].

## 2020-12-29 NOTE — Progress Notes (Signed)
Virtual Visit via Video Note  I connected with Alexandra Boyd on 12/29/20 at  1:50 PM EDT by a video enabled telemedicine application and verified that I am speaking with the correct person using two identifiers.  Location: Patient: home Provider: office   I discussed the limitations of evaluation and management by telemedicine and the availability of in person appointments. The patient expressed understanding and agreed to proceed.  History of Present Illness:  Patient presents today for an acute visit.  This is a video visit.  Patient complains today of recent exposure to poison ivy.  Patient was seen in the ED on 12/05/2018 for this problem.  Patient does show me a rash to her left wrist, abdomen, inner thighs bilaterally.  This rash does appear to be consistent with poison ivy rash.  She has been on extended taper of prednisone and was prescribed Atarax.  She states that her symptoms are not getting better.  We discussed that she can come to office to give an injection.  If this does not help she may need to go to urgent care or the ED for further treatment. Denies f/c/s, n/v/d, hemoptysis, PND, chest pain or edema.      Observations/Objective:  Vitals with BMI 12/04/2020 09/16/2020 08/12/2020  Height - 5' 6.142" 5' 6.142"  Weight - 147 lbs 3 oz 147 lbs 10 oz  BMI - 23.66 23.72  Systolic 96 100 95  Diastolic 65 69 64  Pulse 77 68 78    Skin: vesicular rash noted to left arm, abdomen, and thighs during video visit.    Assessment and Plan:  Dermatitis due to poison ivy:  Will order solu medrol injection - nurse visit scheduled for today  Do not scratch or rub your skin. Put a cold, wet cloth (cold compress) on the affected areas or take baths in cool water. This will help with itching. Avoid hot baths and showers. Take oatmeal baths as needed. Use colloidal oatmeal. You can get this at a pharmacy or grocery store. Follow the instructions on the package. While you have the  rash, wash your clothes right after you wear them.  Follow up:  Please go to the ED if symptoms worsen  Patient Instructions  Dermatitis due to poison ivy:  Will order solu medrol injection - nurse visit scheduled for today  Do not scratch or rub your skin. Put a cold, wet cloth (cold compress) on the affected areas or take baths in cool water. This will help with itching. Avoid hot baths and showers. Take oatmeal baths as needed. Use colloidal oatmeal. You can get this at a pharmacy or grocery store. Follow the instructions on the package. While you have the rash, wash your clothes right after you wear them.  Follow up:  Please go to the ED if symptoms worsen  Poison Ivy Dermatitis Poison ivy dermatitis is redness and soreness of the skin caused by chemicals in the leaves of the poison ivy plant. You may have very bad itching, swelling,a rash, and blisters. What are the causes? Touching a poison ivy plant. Touching something that has the chemical on it. This may include animals or objects that have come in contact with the plant. What increases the risk? Going outdoors often in wooded or [place] areas. Going outdoors without wearing protective clothing, such as closed shoes, long pants, and a long-sleeved shirt. What are the signs or symptoms?  Skin redness. Very bad itching. A rash that often includes bumps and blisters. The  rash usually appears 48 hours after exposure, if you have been exposed before. If this is the first time you have been exposed, the rash may not appear until a week after exposure. Swelling. This may occur if the reaction is very bad. Symptoms usually last for 1-2 weeks. The first time you develop this condition,symptoms may last 3-4 weeks. How is this treated? This condition may be treated with: Hydrocortisone cream or calamine lotion to relieve itching. Oatmeal baths to soothe the skin. Medicines, such as over-the-counter antihistamine tablets. Oral  steroid medicine for more severe reactions. Follow these instructions at home: Medicines Take or apply over-the-counter and prescription medicines only as told by your doctor. Use hydrocortisone cream or calamine lotion as needed to help with itching. General instructions Do not scratch or rub your skin. Put a cold, wet cloth (cold compress) on the affected areas or take baths in cool water. This will help with itching. Avoid hot baths and showers. Take oatmeal baths as needed. Use colloidal oatmeal. You can get this at a pharmacy or grocery store. Follow the instructions on the package. While you have the rash, wash your clothes right after you wear them. Keep all follow-up visits as told by your health care provider. This is important. How is this prevented?  Know what poison ivy looks like, so you can avoid it. This plant has three leaves with flowering branches on a single stem. The leaves are glossy. The leaves have uneven edges that come to a point at the front. If you touch poison ivy, wash your skin with soap and water right away. Be sure to wash under your fingernails. When hiking or camping, wear long pants, a long-sleeved shirt, tall socks, and hiking boots. You can also use a lotion on your skin that helps to prevent contact with poison ivy. If you think that your clothes or outdoor gear came in contact with poison ivy, rinse them off with a garden hose before you bring them inside your house. When doing yard work or gardening, wear gloves, long sleeves, long pants, and boots. Wash your garden tools and gloves if they come in contact with poison ivy. If you think that your pet has come into contact with poison ivy, wash him or her with pet shampoo and water. Make sure to wear gloves while washing your pet.  Contact a doctor if: You have open sores in the rash area. You have more redness, swelling, or pain in the rash area. You have redness that spreads beyond the rash  area. You have fluid, blood, or pus coming from the rash area. You have a fever. You have a rash over a large area of your body. You have a rash on your eyes, mouth, or genitals. Your rash does not get better after a few weeks. Get help right away if: Your face swells or your eyes swell shut. You have trouble breathing. You have trouble swallowing. These symptoms may be an emergency. Do not wait to see if the symptoms will go away. Get medical help right away. Call your local emergency services (911 in the [place] Do not drive yourself to the hospital. Summary Poison ivy dermatitis is redness and soreness of the skin caused by chemicals in the leaves of the poison ivy plant. You may have skin redness, very bad itching, swelling, and a rash. Do not scratch or rub your skin. Take or apply over-the-counter and prescription medicines only as told by your doctor. This information is not intended  to replace advice given to you by your health care provider. Make sure you discuss any questions you have with your healthcare provider. Document Revised: 08/18/2018 Document Reviewed: 04/21/2018 Elsevier Patient Education  2022 [employer].    I discussed the assessment and treatment plan with the patient. The patient was provided an opportunity to ask questions and all were answered. The patient agreed with the plan and demonstrated an understanding of the instructions.   The patient was advised to call back or seek an in-person evaluation if the symptoms worsen or if the condition fails to improve as anticipated.  I provided 23 minutes of non-face-to-face time during this encounter.   Ivonne Andrew, NP

## 2021-01-03 ENCOUNTER — Emergency Department (HOSPITAL_COMMUNITY)
Admission: EM | Admit: 2021-01-03 | Discharge: 2021-01-04 | Disposition: A | Discharge status: Home / Self Care | Payer: Self-pay | Attending: Emergency Medicine | Admitting: Emergency Medicine

## 2021-01-03 ENCOUNTER — Ambulatory Visit (HOSPITAL_COMMUNITY): Admit: 2021-01-03 | Payer: Self-pay

## 2021-01-03 ENCOUNTER — Telehealth: Payer: Self-pay | Admitting: Nurse Practitioner

## 2021-01-03 DIAGNOSIS — L247 Irritant contact dermatitis due to plants, except food: Secondary | ICD-10-CM

## 2021-01-03 LAB — CBC WITH DIFFERENTIAL/PLATELET
Abs Immature Granulocytes: 0.02 10*3/uL (ref 0.00–0.07)
Basophils Absolute: 0 10*3/uL (ref 0.0–0.1)
Basophils Relative: 0 %
Eosinophils Absolute: 0.6 10*3/uL — ABNORMAL HIGH (ref 0.0–0.5)
Eosinophils Relative: 9 %
HCT: 32 % — ABNORMAL LOW (ref 36.0–46.0)
Hemoglobin: 10.1 g/dL — ABNORMAL LOW (ref 12.0–15.0)
Immature Granulocytes: 0 %
Lymphocytes Relative: 30 %
Lymphs Abs: 1.8 10*3/uL (ref 0.7–4.0)
MCH: 28.1 pg (ref 26.0–34.0)
MCHC: 31.6 g/dL (ref 30.0–36.0)
MCV: 89.1 fL (ref 80.0–100.0)
Monocytes Absolute: 0.5 10*3/uL (ref 0.1–1.0)
Monocytes Relative: 7 %
Neutro Abs: 3.2 10*3/uL (ref 1.7–7.7)
Neutrophils Relative %: 54 %
Platelets: 237 10*3/uL (ref 150–400)
RBC: 3.59 MIL/uL — ABNORMAL LOW (ref 3.87–5.11)
RDW: 15.4 % (ref 11.5–15.5)
WBC: 6.1 10*3/uL (ref 4.0–10.5)
nRBC: 0 % (ref 0.0–0.2)

## 2021-01-03 LAB — BASIC METABOLIC PANEL
Anion gap: 5 (ref 5–15)
BUN: 20 mg/dL (ref 6–20)
CO2: 25 mmol/L (ref 22–32)
Calcium: 8.1 mg/dL — ABNORMAL LOW (ref 8.9–10.3)
Chloride: 109 mmol/L (ref 98–111)
Creatinine, Ser: 0.6 mg/dL (ref 0.44–1.00)
GFR, Estimated: >60 mL/min (ref >60)
Glucose, Bld: 112 mg/dL — ABNORMAL HIGH (ref 70–99)
Potassium: 3.7 mmol/L (ref 3.5–5.1)
Sodium: 139 mmol/L (ref 135–145)

## 2021-01-03 NOTE — ED Triage Notes (Signed)
Pt c/o rash x3wks, multiple visits & treatments but nothing has worked. States similar episode occurred "years ago" & had to be admitted/treated in hospital.  8/10 itching, burning pain

## 2021-01-03 NOTE — Progress Notes (Signed)
Virtual Visit Consent   Alexandra Boyd, you are scheduled for a virtual visit with Alexandra Daphine Deutscher, FNP, a [facility] provider, today.     Just as with appointments in the office, your consent must be obtained to participate.  Your consent will be active for this visit and any virtual visit you may have with one of our providers in the next 365 days.     If you have a MyChart account, a copy of this consent can be sent to you electronically.  All virtual visits are billed to your insurance company just like a traditional visit in the office.    As this is a virtual visit, video technology does not allow for your provider to perform a traditional examination.  This may limit your provider's ability to fully assess your condition.  If your provider identifies any concerns that need to be evaluated in person or the need to arrange testing (such as labs, EKG, etc.), we will make arrangements to do so.     Although advances in technology are sophisticated, we cannot ensure that it will always work on either your end or our end.  If the connection with a video visit is poor, the visit may have to be switched to a telephone visit.  With either a video or telephone visit, we are not always able to ensure that we have a secure connection.     I need to obtain your verbal consent now.   Are you willing to proceed with your visit today? YES   Alexandra Boyd has provided verbal consent on 01/03/2021 for a virtual visit (video or telephone).   Alexandra Daphine Deutscher, FNP   Date: 01/03/2021 5:21 PM   Virtual Visit via Video Note   I, Alexandra Boyd, connected with Alexandra Boyd ([id number], July 01, 1977) on 01/03/21 at  5:15 PM EDT by a video-enabled telemedicine application and verified that I am speaking with the correct person using two identifiers.  Location: Patient: Virtual Visit Location Patient: Home Provider: Virtual Visit Location Provider: Mobile   I discussed the  limitations of evaluation and management by telemedicine and the availability of in person appointments. The patient expressed understanding and agreed to proceed.    History of Present Illness: Alexandra Boyd is a 45 y.o. who identifies as a female who was assigned female at birth, and is being seen today for rash.the rash started weeks ago . She went to see her PCP 5 days ago. She was dx with poison Ivy and they gave her a steroid pills. Rash did not go away so she went and got a shot. Rash is spreading and itching. No drainage. Very itchy. She has been using calamine lotion.  Problems:  Patient Active Problem List   Diagnosis Date Noted   Vitamin D deficiency 09/20/2020    Allergies:  Allergies  Allergen Reactions   Fruit & Vegetable Daily [Nutritional Supplements] Swelling    Apples- cause throat swelling   Sulfa Antibiotics Other (See Comments)   Medications:  Current Outpatient Medications:    Cholecalciferol (VITAMIN D3) 25 MCG (1000 UT) CAPS, Take 1 capsule (1,000 Units total) by mouth daily., Disp: 120 capsule, Rfl: 0   EPINEPHrine (EPIPEN 2-PAK) 0.3 mg/0.3 mL IJ SOAJ injection, Inject 0.3 mLs (0.3 mg total) into the muscle as needed for anaphylaxis. /severe allergic reaction (Patient not taking: Reported on 01/10/2020), Disp: 1 Device, Rfl: 11   hydrOXYzine (ATARAX/VISTARIL) 25 MG tablet, Take 0.5-1 tablets (12.5-25 mg total) by  mouth every 8 (eight) hours as needed for itching., Disp: 30 tablet, Rfl: 0   polyethylene glycol (MIRALAX) packet, Take 17 g by mouth daily. Mixed with at least 8 ounces of water as needed for constipation, Disp: 30 each, Rfl: 3  Observations/Objective: Patient is well-developed, well-nourished in no acute distress.  Resting comfortably  at home.  Head is normocephalic, atraumatic.  No labored breathing.  Speech is clear and coherent with logical content.  Patient is alert and oriented at baseline.  Vesicular rash in linear pattern on forearms,  inner thighs and abdomen.  Assessment and Plan:  Alexandra Boyd in today with chief complaint of No chief complaint on file.   1. Irritant contact dermatitis due to plants, except food Patient chose  to go to urgent care to get steroid shot- refused oral meds     Follow Up Instructions: I discussed the assessment and treatment plan with the patient. The patient was provided an opportunity to ask questions and all were answered. The patient agreed with the plan and demonstrated an understanding of the instructions.  A copy of instructions were sent to the patient via MyChart.  The patient was advised to call back or seek an in-person evaluation if the symptoms worsen or if the condition fails to improve as anticipated.  Time:  I spent 10 minutes with the patient via telehealth technology discussing the above problems/concerns.    Alexandra Daphine Deutscher, FNP

## 2021-01-03 NOTE — ED Provider Notes (Addendum)
Emergency Medicine Provider Triage Evaluation Note  Alexandra Boyd , a 45 y.o. female  was evaluated in triage.  Pt complains of rash. Rash x 3 weeks. Pruritic and painful to touch. Started at [facility] inner thighs however has not spread to GU region. Feels like labia is swollen. No dysuria, sob, new lotions, perfumes, vag dc. Similar previous and had to be admitted to hospital. Has been seen 3 x similar complaints since onset. Thinks she might have come into contact with poison ivy at onset.   Review of Systems  Positive: Rash, swelling Negative: Fever, vag dc, sob  Physical Exam  There were no vitals taken for this visit. Gen:   Awake, no distress   Resp:  Normal effort  MSK:   Moves extremities without difficulty  Skin:  Erythema and warmth to Bl inner thigh, GU:  Patient deferred until roomed in back Other:    Medical Decision Making  Medically screening exam initiated at 6:48 PM.  Appropriate orders placed.  Nastasha Reising was informed that the remainder of the evaluation will be completed by another provider, this initial triage assessment does not replace that evaluation, and the importance of remaining in the ED until their evaluation is complete.  rash      Yissel Habermehl A, PA-C 01/03/21 1852    Terald Sleeper, MD 01/03/21 [phone]

## 2021-01-04 MED ORDER — CLOBETASOL PROPIONATE 0.05 % EX OINT: 1.0000 "application " | TOPICAL_OINTMENT | Freq: Two times a day (BID) | CUTANEOUS | 0 refills | Status: AC

## 2021-01-04 MED ORDER — METHYLPREDNISOLONE SODIUM SUCC 125 MG IJ SOLR
125.0000 mg | Freq: Once | INTRAMUSCULAR | Status: AC
  Administered 2021-01-04: 125 mg via INTRAMUSCULAR
  Filled 2021-01-04: qty 2

## 2021-01-04 MED ORDER — HYDROXYZINE HCL 25 MG PO TABS: 25.0000 mg | ORAL_TABLET | Freq: Three times a day (TID) | ORAL | 0 refills | Status: AC | PRN

## 2021-01-04 NOTE — Discharge Instructions (Addendum)
As we discussed I am not worried about a secondary infection on top of the rash that you have.  I recommend you apply the stronger topical steroid twice daily only to the affected areas, discontinue use after 2 to 4 weeks.  Additionally I recommend Sarna anti-itch lotion which you can find over-the-counter.  If the rash is not improved in another 2 weeks I highly recommend you follow-up with dermatology for potential skin biopsy to make sure that poison ivy was in fact the cause of this rash.

## 2021-01-04 NOTE — ED Notes (Signed)
Pt has red raised large areas of skin on inner thighs bilat, pt states it goes to buttocks. Pt states it itches. Pt has scratches on feet bilat and a few places on arms bilat

## 2021-01-04 NOTE — ED Provider Notes (Signed)
[facility] EMERGENCY DEPARTMENT Provider Note   CSN: [id number] Arrival date & time: 01/03/21  1826     History Chief Complaint  Patient presents with   Rash    Alexandra Boyd is a 45 y.o. female presents with approximately 1 month of poison ivy dermatitis refractory to treatment.  Patient reports that she was exposed to poison ivy has a rash that is primarily her inner thighs extending to her but, on her abdomen, and on her arms and scattered locations.  She has been evaluated multiple times for the same issue, has tried oral prednisone taper 2 weeks ago, an intramuscular steroid injection approximately 1 week ago, hydroxyzine for itching, triamcinolone cream, and calamine lotion.  The patient reports some relief with hydroxyzine, and the intramuscular steroid injection but reports that nothing else has helped very much.  The itching is painful and burning and keeping her up at night.  She reports the most relief with hydroxyzine and IM steroid injection, however she is out of her hydroxyzine and her steroid injection wore off after a few days.   Rash     No past medical history on file.  Patient Active Problem List   Diagnosis Date Noted   Vitamin D deficiency 09/20/2020    Past Surgical History:  Procedure Laterality Date   APPENDECTOMY     BREAST BIOPSY Right      OB History     Gravida  2   Para  1   Term      Preterm      AB  1   Living  1      SAB  1   IAB      Ectopic      Multiple      Live Births  1           Family History  Problem Relation Age of Onset   Stomach cancer Father    Hypertension Mother     Social History   Tobacco Use   Smoking status: Never   Smokeless tobacco: Never  Vaping Use   Vaping Use: Never used  Substance Use Topics   Alcohol use: Yes    Comment: OCC   Drug use: No    Home Medications Prior to Admission medications   Medication Sig Start Date End Date Taking? Authorizing  Provider  clobetasol ointment (TEMOVATE) 0.05 % Apply 1 application topically 2 (two) times daily. 01/04/21  Yes Jyaire Koudelka H, PA-C  hydrOXYzine (ATARAX/VISTARIL) 25 MG tablet Take 1 tablet (25 mg total) by mouth every 8 (eight) hours as needed for itching. 01/04/21  Yes Mercedes Fort H, PA-C  Cholecalciferol (VITAMIN D3) 25 MCG (1000 UT) CAPS Take 1 capsule (1,000 Units total) by mouth daily. 09/20/20   Rema Fendt, NP  EPINEPHrine (EPIPEN 2-PAK) 0.3 mg/0.3 mL IJ SOAJ injection Inject 0.3 mLs (0.3 mg total) into the muscle as needed for anaphylaxis. /severe allergic reaction Patient not taking: Reported on 01/10/2020 06/02/18   Fulp, Cammie, MD  polyethylene glycol (MIRALAX) packet Take 17 g by mouth daily. Mixed with at least 8 ounces of water as needed for constipation 06/02/18   Fulp, Cammie, MD    Allergies    Fruit & vegetable daily [nutritional supplements] and Sulfa antibiotics  Review of Systems   Review of Systems  Skin:  Positive for rash.  All other systems reviewed and are negative.  Physical Exam Updated Vital Signs BP 108/82 (BP Location: Right Arm)  Pulse 76   Resp 18   SpO2 98%   Physical Exam Vitals and nursing note reviewed.  Constitutional:      General: She is not in acute distress.    Appearance: Normal appearance.  HENT:     Head: Normocephalic and atraumatic.  Eyes:     General:        Right eye: No discharge.        Left eye: No discharge.  Cardiovascular:     Rate and Rhythm: Normal rate and regular rhythm.  Pulmonary:     Effort: Pulmonary effort is normal. No respiratory distress.  Musculoskeletal:        General: No deformity.  Skin:    General: Skin is warm and dry.     Comments: Large erythematous plaques on bilateral inner thighs, scattered linear excoriations with vesicles on abdomen, arms, buttocks.  No evidence of overlying impetigo or cellulitis in any location.   Neurological:     Mental Status: She is alert and oriented  to person, place, and time.  Psychiatric:        Mood and Affect: Mood normal.        Behavior: Behavior normal.    ED Results / Procedures / Treatments   Labs (all labs ordered are listed, but only abnormal results are displayed) Labs Reviewed  CBC WITH DIFFERENTIAL/PLATELET - Abnormal; Notable for the following components:      Result Value   RBC 3.59 (*)    Hemoglobin 10.1 (*)    HCT 32.0 (*)    Eosinophils Absolute 0.6 (*)    All other components within normal limits  BASIC METABOLIC PANEL - Abnormal; Notable for the following components:   Glucose, Bld 112 (*)    Calcium 8.1 (*)    All other components within normal limits    EKG None  Radiology No results found.  Procedures Procedures   Medications Ordered in ED Medications  methylPREDNISolone sodium succinate (SOLU-MEDROL) 125 mg/2 mL injection 125 mg (has no administration in time range)    ED Course  I have reviewed the triage vital signs and the nursing notes.  Pertinent labs & imaging results that were available during my care of the patient were reviewed by me and considered in my medical decision making (see chart for details).    MDM Rules/Calculators/A&P                         Discussed with the patient that the rash does appear to be a contact dermatitis and based on her description it appears that it is improving somewhat although it is not completely resolved.  We discussed that it is likely too soon to repeat oral steroids as she just received them 2 weeks ago, however would be willing to do another intramuscular steroid injection today.  Patient understands and agrees to injection.  Discussed we will refill patient's Atarax prescription, and send clobetasol ointment which is stronger than her triamcinolone.  Discussed risks and benefits of strong topical steroid use and warned patient not to overuse or use on noninflamed skin.  Initially recommended over-the-counter Sarna lotion for itching.  If rash  is not improved patient encouraged to seek evaluation by dermatologist for skin biopsy at this time. Final Clinical Impression(s) / ED Diagnoses Final diagnoses:  Irritant contact dermatitis due to plants, except food    Rx / DC Orders ED Discharge Orders  Ordered    clobetasol ointment (TEMOVATE) 0.05 %  2 times daily        01/04/21 0859    hydrOXYzine (ATARAX/VISTARIL) 25 MG tablet  Every 8 hours PRN        01/04/21 0859             [place] 01/04/21 0919    Lorre Nick, MD 01/06/21 [phone]

## 2021-01-16 ENCOUNTER — Other Ambulatory Visit: Payer: Self-pay

## 2021-01-16 ENCOUNTER — Ambulatory Visit: Payer: Self-pay | Attending: Family

## 2021-01-27 ENCOUNTER — Other Ambulatory Visit: Payer: Self-pay

## 2021-01-27 ENCOUNTER — Ambulatory Visit
Admission: RE | Admit: 2021-01-27 | Discharge: 2021-01-27 | Disposition: A | Discharge status: Home / Self Care | Payer: No Typology Code available for payment source | Source: Ambulatory Visit | Attending: Family | Admitting: Family

## 2021-01-27 ENCOUNTER — Ambulatory Visit: Payer: Self-pay | Admitting: *Deleted

## 2021-01-27 VITALS — BP 110/72

## 2021-01-27 DIAGNOSIS — Z1231 Encounter for screening mammogram for malignant neoplasm of breast: Secondary | ICD-10-CM

## 2021-01-27 DIAGNOSIS — N644 Mastodynia: Secondary | ICD-10-CM

## 2021-01-27 DIAGNOSIS — Z1239 Encounter for other screening for malignant neoplasm of breast: Secondary | ICD-10-CM

## 2021-01-27 NOTE — Patient Instructions (Signed)
Explained breast self awareness with [name]. Patient did not need a Pap smear today due to last Pap smear and HPV typing was 01/10/2020. Let her know [facility] will cover Pap smears and HPV typing every 5 years unless has a history of abnormal Pap smears. Referred patient to the [facility] for a screening mammogram on mobile unit. Appointment scheduled Tuesday, January 27, 2021 at 1530. Patient escorted to the mobile unit following B[facility] appointment for her screening mammogram. Let patient know the Breast Center will follow up with her within the next couple weeks with results of her mammogram by letter or phone. [name] verbalized understanding.  Leaman Abe, Kathaleen Maser, RN 2:55 PM

## 2021-01-27 NOTE — Progress Notes (Signed)
[name] is a 45 y.o. female who presents to [facility] clinic today with complaint of bilateral breast diffuse breast pain since 2002 that comes and goes. Patient rates the pain at a 6 out of 10.   Patient complained of a vaginal growth. Let patient know that follow-up is not covered by [facility]CP. Patient given the [facility] Assistance Application. Alexandra Dupes, LPN sent referral to the Center for [employer] for follow-up.   Pap Smear: Pap smear not completed today. Last Pap smear was 01/10/2020 at the [facility] clinic and was normal with negative HPV. Per patient has no history of an abnormal Pap smear. Last Pap smear result is available in Epic.   Physical exam: Breasts Breasts symmetrical. No skin abnormalities bilateral breasts. No nipple retraction bilateral breasts. No nipple discharge bilateral breasts. No lymphadenopathy. No lumps palpated bilateral breasts. Complaints of bilateral lower breast pain on exam.  MM DIAG BREAST TOMO BILATERAL  Result Date: 12/28/2016 CLINICAL DATA:  45 year old female presenting with right breast palpable abnormality with increasing intermittent pain. EXAM: 2D DIGITAL DIAGNOSTIC BILATERAL MAMMOGRAM WITH ADJUNCT TOMO ULTRASOUND RIGHT BREAST COMPARISON:  Previous exam(s). ACR Breast Density Category d: The breast tissue is extremely dense, which lowers the sensitivity of mammography. FINDINGS: A radiopaque marker was placed at the site of the patient's palpable abnormality. No suspicious masses or calcifications are identified deep to the marker. A circumscribed isodense mass is noted in the medial right breast on the CC projection only. It localizes centrally on tomosynthesis. No suspicious masses or calcifications are identified on the left. On physical exam, I palpate no physical abnormalities. Targeted ultrasound is performed, showing no focal abnormalities at the site of the patient's palpable abnormality. An  incidental oval, circumscribed hypoechoic mass likely representing a complicated cyst is identified at the 9 o'clock position 3 cm from the nipple. It measures 0.7 x 0.6 x 0.7 cm. There is no internal vascularity. A similar appearing mass is noted at the 2 o'clock position 1 cm from the nipple. It measures 0.8 x 0.7 x 0.9 cm. This likely corresponds with the mass in the medial right breast identified on mammogram. Multiple other simple appearing cysts are identified throughout the right breast. IMPRESSION: 1. Two probably benign right breast masses likely representing complicated cysts. Recommendation is for six-month ultrasound and mammographic follow-up. 2. No mammographic or sonographic findings to explain the patient's right breast palpable abnormality and pain. Recommendation is for clinical and symptomatic follow-up. 3. No mammographic evidence of malignancy on the left. RECOMMENDATION: Diagnostic mammogram and ultrasound of the right breast in 6 months. I have discussed the findings and recommendations with the patient. Results were also provided in writing at the conclusion of the visit. If applicable, a reminder letter will be sent to the patient regarding the next appointment. BI-RADS CATEGORY  3: Probably benign. Electronically Signed   By: Sande Brothers M.D.   On: 12/28/2016 11:30   MS DIGITAL DIAG TOMO BILAT  Result Date: 01/15/2020 CLINICAL DATA:  45 year old female presenting with new bilateral breast pain and lumps. Additionally patient has two probably benign right breast masses that were evaluated in 2018 but she did not present for recommended follow-up. EXAM: DIGITAL DIAGNOSTIC BILATERAL MAMMOGRAM WITH CAD AND TOMO ULTRASOUND BILATERAL BREAST COMPARISON:  Previous exam(s). ACR Breast Density Category d: The breast tissue is extremely dense, which lowers the sensitivity of mammography. FINDINGS: Mammogram: Right breast: Spot compression tomosynthesis views were performed in addition to  standard views.  A skin BB marks the palpable/painful site of concern reported by the patient in the upper-outer quadrant. At the palpable site of concern there is a oval circumscribed mass measuring approximately 2.7 cm. There are multiple additional oval circumscribed masses in the right breast. There is no suspicious finding. Left breast: Spot compression tomosynthesis views were performed in addition to standard views. A skin BB marks the palpable site of concern reported by the patient in the lower outer left breast. At the palpable site there is an oval circumscribed mass measuring approximately 2.8 cm. There are additional oval circumscribed masses in the left breast. There is no suspicious finding. Mammographic images were processed with CAD. On physical exam, I feel mobile masses at the palpable/painful sites of concern reported by the patient in the right and left breast. Ultrasound: Right breast: Targeted ultrasound is performed at the palpable site of concern reported by the patient in the outer right breast at 9 o'clock 5 cm from the nipple demonstrating an oval circumscribed anechoic mass measuring 2.7 x 1.1 x 2.7 cm, consistent with a benign simple cyst. Targeted ultrasound is performed at 2 o'clock 1 cm from the nipple demonstrating an oval circumscribed hypoechoic mass measuring 0.7 x 0.6 x 0.6 cm, previously measuring 0.8 x 0.7 x 0.9 cm. Targeted ultrasound performed at 9 o'clock 3 cm from nipple demonstrates an oval circumscribed hypoechoic mass measuring 0.6 x 0.5 x 0.4 cm, previously measuring 0.7 x 0.6 x 0.7 cm. Left breast: Targeted ultrasound is performed at the palpable/painful site of concern reported by the patient at 4 o'clock 5 cm from the nipple demonstrating an oval circumscribed anechoic mass measuring 2.9 x 1.9 x 2.4 cm. IMPRESSION: 1. At the palpable/painful sites of concern reported by the patient in the right breast at 9 o'clock and in the left breast at 4 o'clock there are benign  simple cysts. 2. Interval decrease in size of the two probably benign right breast masses at 2 o'clock and 9 o'clock. Given stability for over 2 years, these are considered benign. RECOMMENDATION: 1. Clinical follow-up as needed for the bilateral cysts. Given the associated pain, these would be amenable to ultrasound-guided core needle aspiration if desired by the patient. 2.  Screening mammogram in one year.(Code:SM-B-01Y) I have discussed the findings and recommendations with the patient. If applicable, a reminder letter will be sent to the patient regarding the next appointment. BI-RADS CATEGORY  2: Benign. Electronically Signed   By: Emmaline Kluver M.D.   On: 01/15/2020 16:34         Pelvic/Bimanual Pap is not indicated today per BCCCP guidelines.   Smoking History: Patient has never smoked.   Patient Navigation: Patient education provided. Access to services provided for patient through B[facility]CP program.    Breast and Cervical Cancer Risk Assessment: Patient does not have family history of breast cancer, known genetic mutations, or radiation treatment to the chest before age 70. Patient does not have history of cervical dysplasia, immunocompromised, or DES exposure in-utero.  Risk Assessment     Risk Scores       01/27/2021 01/10/2020   Last edited by: Narda Rutherford, LPN Meryl Dare, CMA   5-year risk: 1 % 1 %   Lifetime risk: 10 % 10.1 %            A: B[facility]CP exam without pap smear Complaint of bilateral breast pain.  P: Referred patient to the [facility] for a screening mammogram on mobile unit. Appointment  scheduled Tuesday, January 27, 2021 at 1530.  Priscille Heidelberg, RN 01/27/2021 2:55 PM

## 2021-02-12 ENCOUNTER — Ambulatory Visit: Payer: Self-pay

## 2021-02-12 NOTE — Progress Notes (Signed)
Mammogram without malignancy. Repeat mammogram in 1 year or sooner if needed.

## 2021-03-13 ENCOUNTER — Encounter: Payer: No Typology Code available for payment source | Admitting: Obstetrics & Gynecology

## 2023-05-12 IMAGING — MG MM DIGITAL SCREENING BILAT W/ TOMO AND CAD
8 series · 9 of 24 positions shown · non-contrast
Comparison: Previous exam(s).

CLINICAL DATA: Screening.

EXAM:
DIGITAL SCREENING BILATERAL MAMMOGRAM WITH TOMOSYNTHESIS AND CAD
TECHNIQUE: Bilateral screening digital craniocaudal and mediolateral oblique
mammograms were obtained. Bilateral screening digital breast
tomosynthesis was performed. The images were evaluated with
computer-aided detection.

[R CC synth-2D]
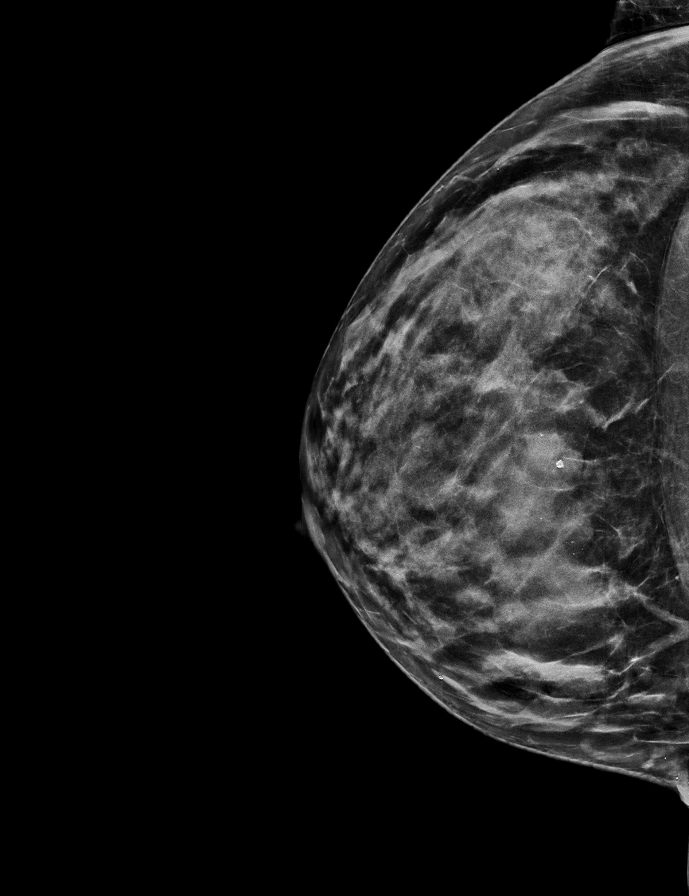

[R MLO synth-2D]
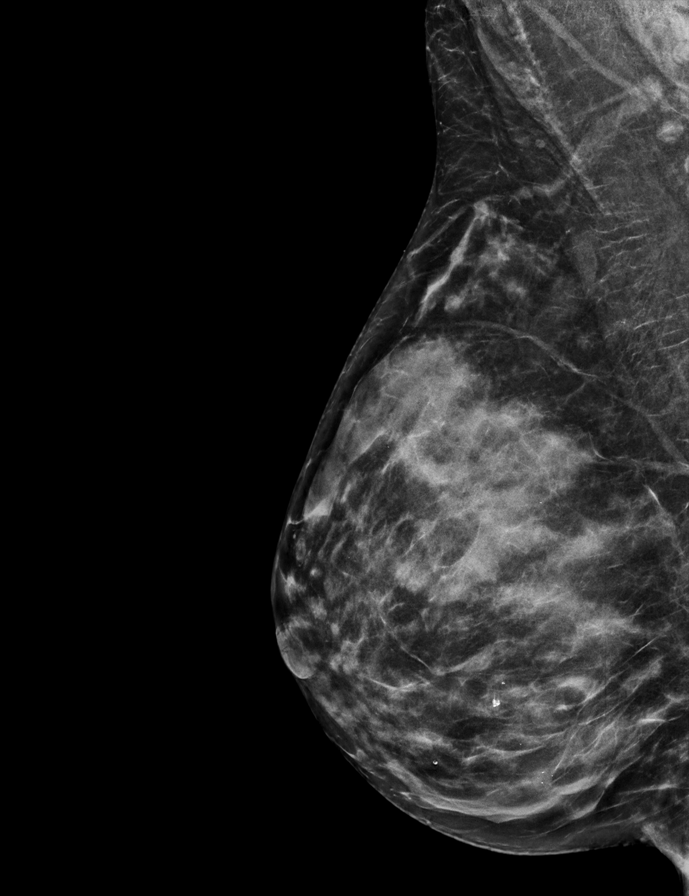

[L CC synth-2D]
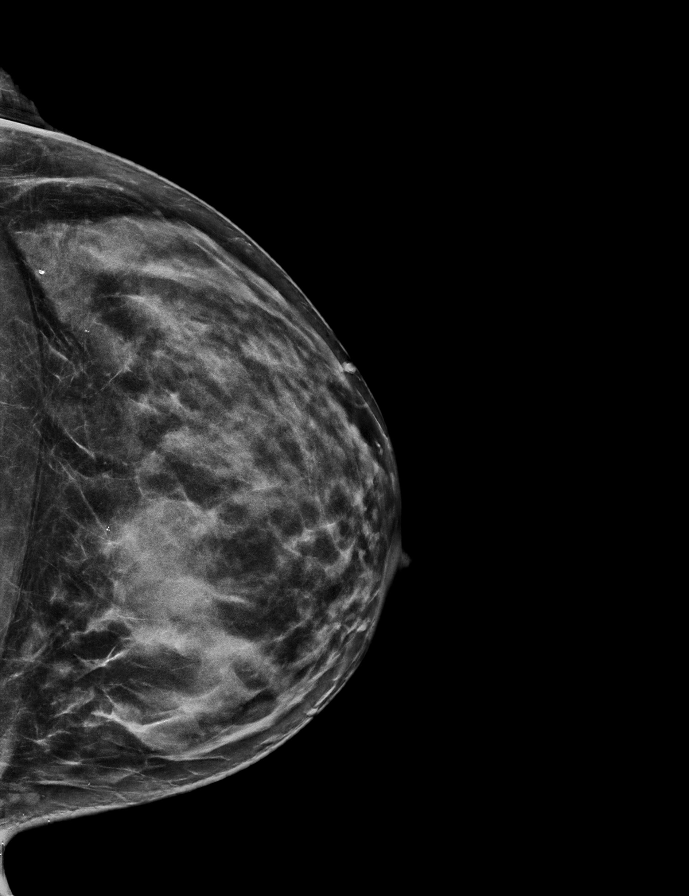

[L MLO synth-2D]
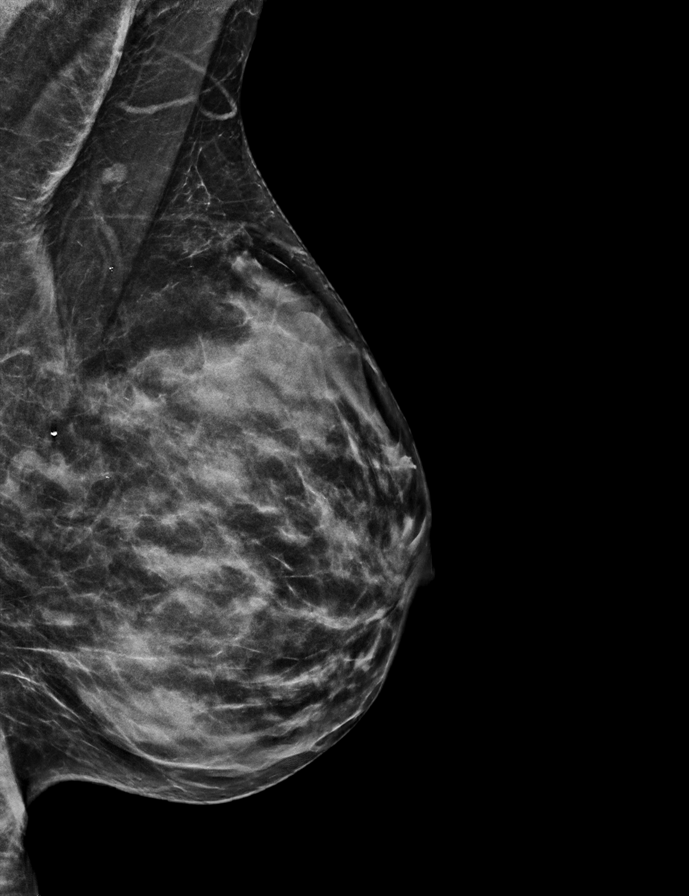

[L MLO tomo · 2 of 55 frames shown]
[frame 18/55]
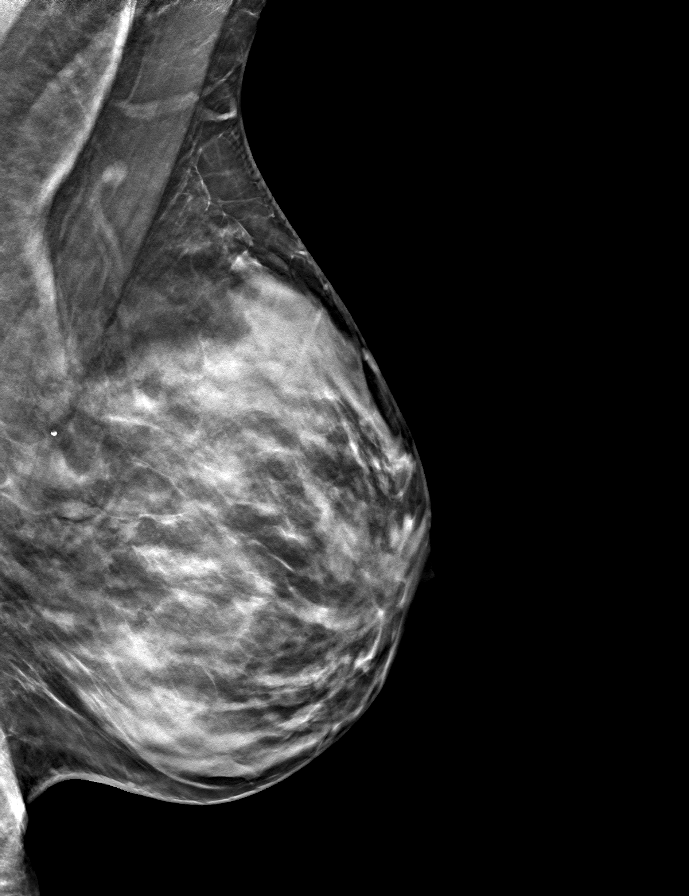
[frame 28/55]
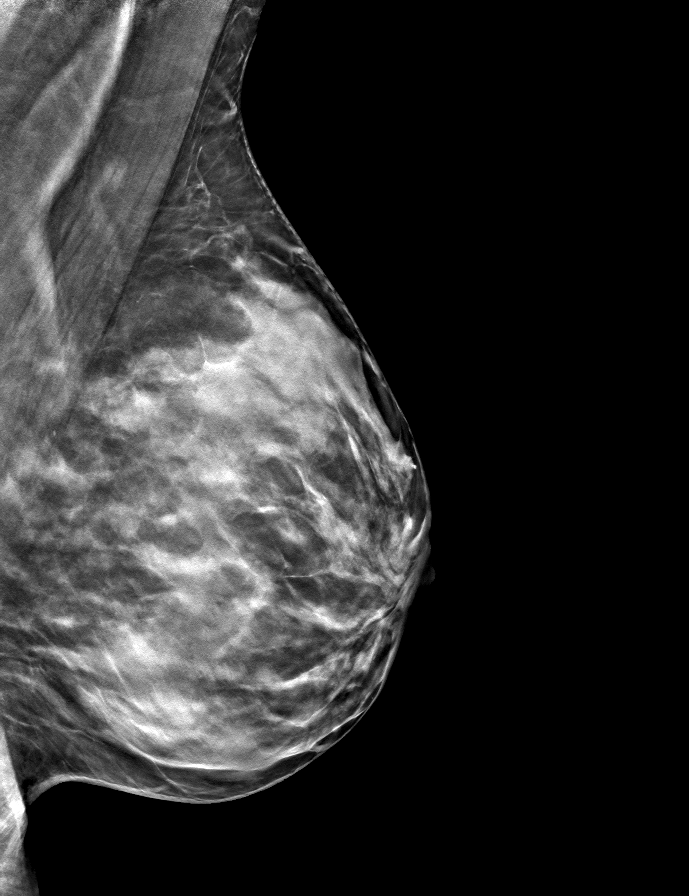

[L CC tomo · tomo slice 29/58.0]
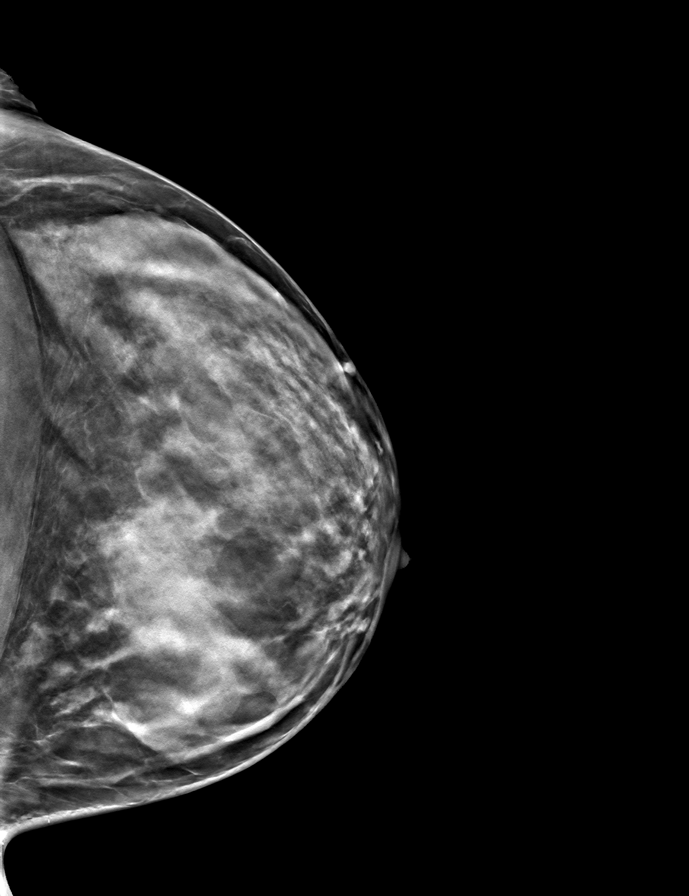

[R MLO tomo · tomo slice 29/56.0]
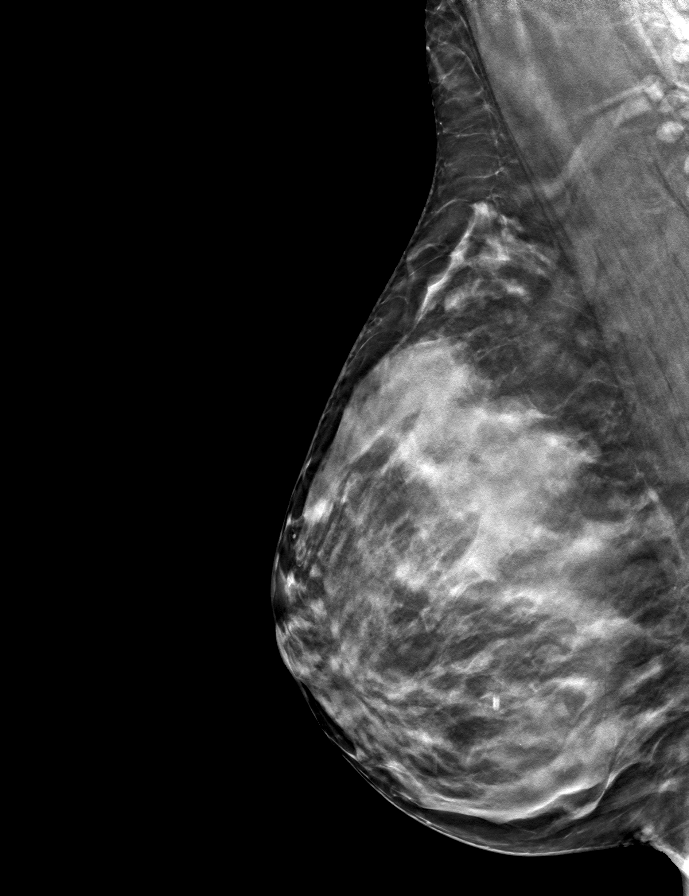

[R CC tomo · tomo slice 29/57.0]
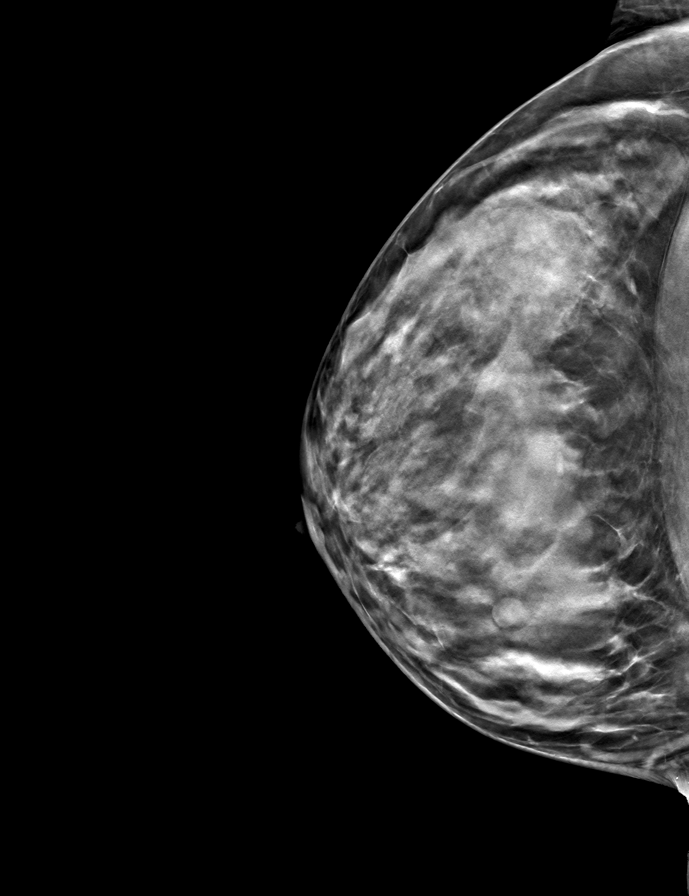

[9 of 24 positions shown; findings below may reference images not displayed]

ACR Breast Density Category d: The breast tissue is extremely dense,
which lowers the sensitivity of mammography
FINDINGS: There are no findings suspicious for malignancy.
IMPRESSION: No mammographic evidence of malignancy. A result letter of this
screening mammogram will be mailed directly to the patient.

RECOMMENDATION:
Screening mammogram in one year. (Code:TA-[id number]-WV9)

BI-RADS CATEGORY  1: Negative.
